# Patient Record
Sex: Male | Born: 1980 | Race: Black or African American | Hispanic: No | Marital: Single | Smoking: Current every day smoker
Health system: Southern US, Community
[De-identification: ages and names within clinical notes are randomized; demographics above are authoritative.]

## PROBLEM LIST (undated history)

## (undated) DIAGNOSIS — K219 Gastro-esophageal reflux disease without esophagitis: Secondary | ICD-10-CM

## (undated) DIAGNOSIS — F419 Anxiety disorder, unspecified: Secondary | ICD-10-CM

## (undated) DIAGNOSIS — J189 Pneumonia, unspecified organism: Secondary | ICD-10-CM

## (undated) DIAGNOSIS — F8081 Childhood onset fluency disorder: Secondary | ICD-10-CM

## (undated) DIAGNOSIS — I499 Cardiac arrhythmia, unspecified: Secondary | ICD-10-CM

## (undated) HISTORY — PX: KNEE SURGERY: SHX244

## (undated) HISTORY — PX: KNEE ARTHROSCOPY W/ ACL RECONSTRUCTION: SHX1858

---
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
## 2001-07-13 ENCOUNTER — Emergency Department (HOSPITAL_COMMUNITY): Admission: EM | Admit: 2001-07-13 | Discharge: 2001-07-13 | Payer: Self-pay | Admitting: Emergency Medicine

## 2001-08-06 ENCOUNTER — Emergency Department (HOSPITAL_COMMUNITY): Admission: EM | Admit: 2001-08-06 | Discharge: 2001-08-06 | Payer: Self-pay

## 2001-08-06 ENCOUNTER — Encounter: Payer: Self-pay | Admitting: Emergency Medicine

## 2002-06-17 ENCOUNTER — Emergency Department (HOSPITAL_COMMUNITY): Admission: EM | Admit: 2002-06-17 | Discharge: 2002-06-18 | Payer: Self-pay | Admitting: Emergency Medicine

## 2002-06-17 ENCOUNTER — Encounter: Payer: Self-pay | Admitting: Emergency Medicine

## 2003-02-14 ENCOUNTER — Encounter: Payer: Self-pay | Admitting: Emergency Medicine

## 2003-02-14 ENCOUNTER — Emergency Department (HOSPITAL_COMMUNITY): Admission: EM | Admit: 2003-02-14 | Discharge: 2003-02-14 | Payer: Self-pay

## 2003-06-27 ENCOUNTER — Encounter: Payer: Self-pay | Admitting: Emergency Medicine

## 2003-06-27 ENCOUNTER — Emergency Department (HOSPITAL_COMMUNITY): Admission: EM | Admit: 2003-06-27 | Discharge: 2003-06-27 | Payer: Self-pay | Admitting: Emergency Medicine

## 2003-09-28 ENCOUNTER — Other Ambulatory Visit: Payer: Self-pay

## 2005-10-29 ENCOUNTER — Emergency Department: Payer: Self-pay | Admitting: Emergency Medicine

## 2006-07-25 ENCOUNTER — Emergency Department (HOSPITAL_COMMUNITY): Admission: EM | Admit: 2006-07-25 | Discharge: 2006-07-25 | Payer: Self-pay | Admitting: Emergency Medicine

## 2006-11-30 ENCOUNTER — Emergency Department (HOSPITAL_COMMUNITY): Admission: EM | Admit: 2006-11-30 | Discharge: 2006-11-30 | Payer: Self-pay | Admitting: Family Medicine

## 2006-12-11 ENCOUNTER — Emergency Department (HOSPITAL_COMMUNITY): Admission: EM | Admit: 2006-12-11 | Discharge: 2006-12-11 | Payer: Self-pay | Admitting: Family Medicine

## 2007-04-12 ENCOUNTER — Other Ambulatory Visit: Payer: Self-pay

## 2007-04-12 ENCOUNTER — Emergency Department: Payer: Self-pay | Admitting: Emergency Medicine

## 2007-04-19 ENCOUNTER — Emergency Department: Payer: Self-pay | Admitting: Emergency Medicine

## 2007-09-23 ENCOUNTER — Emergency Department: Payer: Self-pay | Admitting: Emergency Medicine

## 2007-09-23 ENCOUNTER — Other Ambulatory Visit: Payer: Self-pay

## 2007-10-09 ENCOUNTER — Emergency Department: Payer: Self-pay | Admitting: Emergency Medicine

## 2007-10-20 ENCOUNTER — Emergency Department (HOSPITAL_COMMUNITY): Admission: EM | Admit: 2007-10-20 | Discharge: 2007-10-20 | Payer: Self-pay | Admitting: Emergency Medicine

## 2007-10-30 ENCOUNTER — Emergency Department: Payer: Self-pay | Admitting: Emergency Medicine

## 2007-10-30 ENCOUNTER — Other Ambulatory Visit: Payer: Self-pay

## 2011-12-18 ENCOUNTER — Encounter (HOSPITAL_COMMUNITY): Payer: Self-pay | Admitting: Emergency Medicine

## 2011-12-18 ENCOUNTER — Emergency Department (INDEPENDENT_AMBULATORY_CARE_PROVIDER_SITE_OTHER)
Admission: EM | Admit: 2011-12-18 | Discharge: 2011-12-18 | Disposition: A | Discharge status: Home / Self Care | Payer: Self-pay | Source: Home / Self Care | Attending: Emergency Medicine | Admitting: Emergency Medicine

## 2011-12-18 DIAGNOSIS — K047 Periapical abscess without sinus: Secondary | ICD-10-CM

## 2011-12-18 MED ORDER — HYDROCODONE-ACETAMINOPHEN 5-500 MG PO TABS: 1.0000 | ORAL_TABLET | Freq: Four times a day (QID) | ORAL | Status: AC | PRN

## 2011-12-18 MED ORDER — PENICILLIN V POTASSIUM 500 MG PO TABS: 500.0000 mg | ORAL_TABLET | Freq: Four times a day (QID) | ORAL | Status: AC

## 2011-12-18 NOTE — ED Notes (Signed)
Tooth pain for a "long time" that has gotten worse over last several days. Pt states today he noted some swelling and even further increased pain. Pt has difficulty chewing. OTC meds do not seem to help. Pain is "20/10"

## 2011-12-18 NOTE — ED Provider Notes (Signed)
History     CSN: [id number]  Arrival date & time 12/18/11  1618   First MD Initiated Contact with Patient 12/18/11 1627      Chief Complaint  Patient presents with  . Dental Pain    (Consider location/radiation/quality/duration/timing/severity/associated sxs/prior treatment) HPI Comments: Patient presents urgent care complaining of left lower dental pain has been going on for months. Within the last 3 days the last left lower molar has been throbbing and hurting woke up this morning with swelling of his left lower jaw line. No fevers no further symptoms.  Patient is a 31 y.o. male presenting with tooth pain. The history is provided by the patient.  Dental PainThe primary symptoms include mouth pain and shortness of breath. Primary symptoms do not include dental injury, oral bleeding, fever, sore throat, angioedema or cough. The symptoms began more than 1 month ago. The symptoms are worsening. The symptoms occur constantly.  Additional symptoms include: dental sensitivity to temperature, gum swelling and gum tenderness. Additional symptoms do not include: trouble swallowing, pain with swallowing, excessive salivation, dry mouth, swollen glands and fatigue. Medical issues do not include: alcohol problem.    History reviewed. No pertinent past medical history.  History reviewed. No pertinent past surgical history.  History reviewed. No pertinent family history.  History  Substance Use Topics  . Smoking status: Not on file  . Smokeless tobacco: Not on file  . Alcohol Use: Not on file      Review of Systems  Constitutional: Negative for fever, chills and fatigue.  HENT: Negative for sore throat and trouble swallowing.   Eyes: Negative for pain.  Respiratory: Positive for shortness of breath. Negative for cough.     Allergies  Review of patient's allergies indicates no known allergies.  Home Medications   Current Outpatient Rx  Name Route Sig Dispense Refill  .  HYDROCODONE-ACETAMINOPHEN 5-500 MG PO TABS Oral Take 1 tablet by mouth every 6 (six) hours as needed for pain. 15 tablet 0  . PENICILLIN V POTASSIUM 500 MG PO TABS Oral Take 1 tablet (500 mg total) by mouth 4 (four) times daily. 20 tablet 0    BP 125/69  Pulse 68  Temp(Src) 98.8 F (37.1 C) (Oral)  Resp 16  SpO2 100%  Physical Exam  Constitutional: He appears well-developed and well-nourished. No distress.  HENT:  Head: Normocephalic. No trismus in the jaw.  Mouth/Throat: Oropharynx is clear and moist and mucous membranes are normal. No oral lesions. Dental abscesses and dental caries present. No uvula swelling.    Neck: Neck supple. No JVD present.  Lymphadenopathy:    He has no cervical adenopathy.  Neurological: He is alert.  Skin: Skin is warm. No erythema.    ED Course  Procedures (including critical care time)  Labs Reviewed - No data to display No results found.   1. Abscess, dental       MDM  Early abscess formation L lower molar        Jimmie Molly, MD 12/18/11 1657

## 2011-12-18 NOTE — Discharge Instructions (Signed)
Please call is please call the dentist as discussed as stated in the followup section of your discharge instructions to frequent mouthwash especially after feedings. Start with antibiotics as prescribed and he is pain medicine with precautions as you can become dizzy or drowsy.    Dental Abscess A dental abscess usually starts from an infected tooth. Antibiotic medicine and pain pills can be helpful, but dental infections require the attention of a dentist. Rinse around the infected area often with salt water (a pinch of salt in 8 oz of warm water). Do not apply heat to the outside of your face. See your dentist or oral surgeon as soon as possible.  SEEK IMMEDIATE MEDICAL CARE IF:  You have increasing, severe pain that is not relieved by medicine.   You or your child has an oral temperature above 102 F (38.9 C), not controlled by medicine.   Your baby is older than 3 months with a rectal temperature of 102 F (38.9 C) or higher.   Your baby is 68 months old or younger with a rectal temperature of 100.4 F (38 C) or higher.   You develop chills, severe headache, difficulty breathing, or trouble swallowing.   You have swelling in the neck or around the eye.  Document Released: 10/20/2005 Document Revised: 07/02/2011 Document Reviewed: 03/31/2007 [facility] Patient Information 2012 [place], [place].

## 2013-06-22 ENCOUNTER — Encounter (HOSPITAL_COMMUNITY): Payer: Self-pay | Admitting: *Deleted

## 2013-06-22 ENCOUNTER — Emergency Department (HOSPITAL_COMMUNITY)
Admission: EM | Admit: 2013-06-22 | Discharge: 2013-06-23 | Disposition: A | Discharge status: Home / Self Care | Payer: Self-pay | Attending: Emergency Medicine | Admitting: Emergency Medicine

## 2013-06-22 DIAGNOSIS — Z23 Encounter for immunization: Secondary | ICD-10-CM | POA: Insufficient documentation

## 2013-06-22 DIAGNOSIS — T23219A Burn of second degree of unspecified thumb (nail), initial encounter: Secondary | ICD-10-CM | POA: Insufficient documentation

## 2013-06-22 DIAGNOSIS — T23159A Burn of first degree of unspecified palm, initial encounter: Secondary | ICD-10-CM | POA: Insufficient documentation

## 2013-06-22 DIAGNOSIS — X19XXXA Contact with other heat and hot substances, initial encounter: Secondary | ICD-10-CM | POA: Insufficient documentation

## 2013-06-22 DIAGNOSIS — Y93G3 Activity, cooking and baking: Secondary | ICD-10-CM | POA: Insufficient documentation

## 2013-06-22 DIAGNOSIS — T23102A Burn of first degree of left hand, unspecified site, initial encounter: Secondary | ICD-10-CM

## 2013-06-22 DIAGNOSIS — Y9289 Other specified places as the place of occurrence of the external cause: Secondary | ICD-10-CM | POA: Insufficient documentation

## 2013-06-22 NOTE — ED Notes (Signed)
Pt states he was cooking this pm and grabbed a hot pot; pt with blistering and blanching to left thumb, palm and pointer finger.

## 2013-06-23 MED ORDER — OXYCODONE-ACETAMINOPHEN 5-325 MG PO TABS: 1.0000 | ORAL_TABLET | ORAL | Status: DC | PRN

## 2013-06-23 MED ORDER — TETANUS-DIPHTH-ACELL PERTUSSIS 5-2.5-18.5 LF-MCG/0.5 IM SUSP
0.5000 mL | Freq: Once | INTRAMUSCULAR | Status: AC
  Administered 2013-06-23: 0.5 mL via INTRAMUSCULAR
  Filled 2013-06-23: qty 0.5

## 2013-06-23 MED ORDER — OXYCODONE-ACETAMINOPHEN 5-325 MG PO TABS
1.0000 | ORAL_TABLET | Freq: Once | ORAL | Status: AC
  Administered 2013-06-23: 1 via ORAL
  Filled 2013-06-23: qty 1

## 2013-06-23 MED ORDER — SILVER SULFADIAZINE 1 % EX CREA
TOPICAL_CREAM | Freq: Once | CUTANEOUS | Status: AC
  Administered 2013-06-23: 01:00:00 via TOPICAL
  Filled 2013-06-23: qty 50
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
## 2013-06-23 NOTE — ED Provider Notes (Signed)
Medical screening examination/treatment/procedure(s) were performed by non-physician practitioner and as supervising physician I was immediately available for consultation/collaboration.  Sunnie Nielsen, MD 06/23/13 [phone]

## 2013-06-23 NOTE — ED Notes (Signed)
Pt's L hand dressed with sulfadine cream, Telfa gauze and bulky gauze dressing. Pt instructed on further wound care.

## 2013-11-06 ENCOUNTER — Emergency Department (HOSPITAL_COMMUNITY)
Admission: EM | Admit: 2013-11-06 | Discharge: 2013-11-06 | Disposition: A | Discharge status: Home / Self Care | Payer: Self-pay | Attending: Emergency Medicine | Admitting: Emergency Medicine

## 2013-11-06 ENCOUNTER — Encounter (HOSPITAL_COMMUNITY): Payer: Self-pay | Admitting: Emergency Medicine

## 2013-11-06 ENCOUNTER — Emergency Department (HOSPITAL_COMMUNITY): Payer: Self-pay

## 2013-11-06 DIAGNOSIS — R143 Flatulence: Secondary | ICD-10-CM

## 2013-11-06 DIAGNOSIS — R110 Nausea: Secondary | ICD-10-CM | POA: Insufficient documentation

## 2013-11-06 DIAGNOSIS — F17200 Nicotine dependence, unspecified, uncomplicated: Secondary | ICD-10-CM | POA: Insufficient documentation

## 2013-11-06 DIAGNOSIS — R079 Chest pain, unspecified: Secondary | ICD-10-CM

## 2013-11-06 DIAGNOSIS — R142 Eructation: Secondary | ICD-10-CM | POA: Insufficient documentation

## 2013-11-06 DIAGNOSIS — R002 Palpitations: Secondary | ICD-10-CM

## 2013-11-06 DIAGNOSIS — R141 Gas pain: Secondary | ICD-10-CM | POA: Insufficient documentation

## 2013-11-06 DIAGNOSIS — R0602 Shortness of breath: Secondary | ICD-10-CM | POA: Insufficient documentation

## 2013-11-06 DIAGNOSIS — R209 Unspecified disturbances of skin sensation: Secondary | ICD-10-CM | POA: Insufficient documentation

## 2013-11-06 DIAGNOSIS — R42 Dizziness and giddiness: Secondary | ICD-10-CM | POA: Insufficient documentation

## 2013-11-06 DIAGNOSIS — R0789 Other chest pain: Secondary | ICD-10-CM | POA: Insufficient documentation

## 2013-11-06 LAB — CBC WITH DIFFERENTIAL/PLATELET
Basophils Absolute: 0 10*3/uL (ref 0.0–0.1)
Basophils Relative: 0 % (ref 0–1)
Eosinophils Absolute: 0.1 10*3/uL (ref 0.0–0.7)
Eosinophils Relative: 2 % (ref 0–5)
HCT: 39.4 % (ref 39.0–52.0)
Hemoglobin: 13.4 g/dL (ref 13.0–17.0)
Lymphocytes Relative: 43 % (ref 12–46)
Lymphs Abs: 2.6 10*3/uL (ref 0.7–4.0)
MCH: 26.6 pg (ref 26.0–34.0)
MCHC: 34 g/dL (ref 30.0–36.0)
MCV: 78.2 fL (ref 78.0–100.0)
Monocytes Absolute: 1 10*3/uL (ref 0.1–1.0)
Monocytes Relative: 16 % — ABNORMAL HIGH (ref 3–12)
Neutro Abs: 2.4 10*3/uL (ref 1.7–7.7)
Neutrophils Relative %: 39 % — ABNORMAL LOW (ref 43–77)
Platelets: 227 10*3/uL (ref 150–400)
RBC: 5.04 MIL/uL (ref 4.22–5.81)
RDW: 14.8 % (ref 11.5–15.5)
WBC: 6.1 10*3/uL (ref 4.0–10.5)

## 2013-11-06 LAB — COMPREHENSIVE METABOLIC PANEL
ALT: 26 U/L (ref 0–53)
AST: 20 U/L (ref 0–37)
Albumin: 3.7 g/dL (ref 3.5–5.2)
Alkaline Phosphatase: 130 U/L — ABNORMAL HIGH (ref 39–117)
BUN: 6 mg/dL (ref 6–23)
CO2: 26 mEq/L (ref 19–32)
Calcium: 9 mg/dL (ref 8.4–10.5)
Chloride: 101 mEq/L (ref 96–112)
Creatinine, Ser: 0.73 mg/dL (ref 0.50–1.35)
GFR calc Af Amer: >90 mL/min (ref >90)
GFR calc non Af Amer: >90 mL/min (ref >90)
Glucose, Bld: 102 mg/dL — ABNORMAL HIGH (ref 70–99)
Potassium: 3.4 mEq/L — ABNORMAL LOW (ref 3.7–5.3)
Sodium: 139 mEq/L (ref 137–147)
Total Bilirubin: 0.4 mg/dL (ref 0.3–1.2)
Total Protein: 7.6 g/dL (ref 6.0–8.3)

## 2013-11-06 LAB — TROPONIN I: Troponin I: <0.3 ng/mL (ref <0.30)

## 2013-11-06 NOTE — Discharge Instructions (Signed)
First, avoid caffenated drinks such as sodas as it can cause your heart to race.  Follow up with cardiologist for further evaluation of your symptoms.  Return to ER if worsen.    Cardiac Arrhythmia Your heart is a muscle that works to pump blood through your body by regular contractions. The beating of your heart is controlled by a system of special pacemaker cells. These cells control the electrical activity of the heart. When the system controlling this regular beating is disturbed, a heart rhythm abnormality (arrhythmia) results. WHEN YOUR HEART SKIPS A BEAT One of the most common and least serious heart arrhythmias is called an ectopic or premature atrial heartbeat (PAC). This may be noticed as a small change in your regular pulse. A PAC originates from the top part (atrium) of the heart. Within the right atrium, the SA node is the area that normally controls the regularity of the heart. PACs occur in heart tissue outside of the SA node region. You may feel this as a skipped beat or heart flutter, especially if several occur in succession or occur frequently.  Another arrhythmia is ventricular premature complex (VCP or PVC). These extra beats start out in the bottom, more muscular chambers of the heart. In most cases a PVC is harmless. If there are underlying causes that are making the heart irritable such as an overactive thyroid or a prior heart attack PVCs may be of more concern. In a few cases, medications to control the heart rhythm may be prescribed. Things to try at home:  Cut down or avoid alcohol, tobacco and caffeine.  Get enough sleep.  Reduce stress.  Exercise more. WHEN THE HEART BEATS TOO FAST Atrial tachycardia is a fast heart rate, which starts out in the atrium. It may last from minutes to much longer. Your heart may beat 140 to 240 times per minute instead of the normal 60 to 100.  Symptoms include a worried feeling (anxiety) and a sense that your heart is beating fast and  hard.  You may be able to stop the fast rate by holding your breath or bearing down as if you were going to have a bowel movement.  This type of fast rate is usually not dangerous. Atrial fibrillation and atrial flutter are other fast rhythms that start in the atria. Both conditions keep the atria from filling with enough blood so the heart does not work well.  Symptoms include feeling light-headed or faint.  These fast rates may be the result of heart damage or disease. Too much thyroid hormone may play a role.  There may be no clear cause or it may be from heart disease or damage.  Medication or a special electrical treatment (cardioversion) may be needed to get the heart beating normally. Ventricular tachycardia is a fast heart rate that starts in the lower muscular chambers (ventricles) This is a serious disorder that requires treatment as soon as possible. You need someone else to get and use a small defibrillator.  Symptoms include collapse, chest pain, or being short of breath.  Treatment may include medication, procedures to improve blood flow to the heart, or an implantable cardiac defibrillator (ICD). DIAGNOSIS   A cardiogram (EKG or ECG) will be done to see the arrhythmia, as well as lab tests to check the underlying cause.  If the extra beats or fast rate come and go, you may wear a Holter monitor that records your heart rate for a longer period of time. SEEK MEDICAL CARE IF:  You have irregular or fast heartbeats (palpitations).  You experience skipped beats.  You develop lightheadedness.  You have chest discomfort.  You have shortness of breath.  You have more frequent episodes, if you are already being treated. SEEK IMMEDIATE MEDICAL CARE IF:   You have severe chest pain, especially if the pain is crushing or pressure-like and spreads to the arms, back, neck, or jaw, or if you have sweating, feeling sick to your stomach (nausea), or shortness of breath. THIS IS  AN EMERGENCY. Do not wait to see if the pain will go away. Get medical help at once. Call 911 or 0 (operator). DO NOT drive yourself to the hospital.  You feel dizzy or faint.  You have episodes of previously documented atrial tachycardia that do not resolve with the techniques your caregiver has taught you.  Irregular or rapid heartbeats begin to occur more often than in the past, especially if they are associated with more pronounced symptoms or of longer duration. Document Released: 10/20/2005 Document Revised: 01/12/2012 Document Reviewed: 06/07/2008 [facility] Patient Information 2014 [place], [place].   Emergency Department Resource Guide 1) Find a Doctor and Pay Out of Pocket Although you won't have to find out who is covered by your insurance plan, it is a good idea to ask around and get recommendations. You will then need to call the office and see if the doctor you have chosen will accept you as a new patient and what types of options they offer for patients who are self-pay. Some doctors offer discounts or will set up payment plans for their patients who do not have insurance, but you will need to ask so you aren't surprised when you get to your appointment.  2) Contact Your Local Health Department Not all health departments have doctors that can see patients for sick visits, but many do, so it is worth a call to see if yours does. If you don't know where your local health department is, you can check in your phone book. The CDC also has a tool to help you locate your state's health department, and many state websites also have listings of all of their local health departments.  3) Find a Walk-in Clinic If your illness is not likely to be very severe or complicated, you may want to try a walk in clinic. These are popping up all over the country in pharmacies, drugstores, and shopping centers. They're usually staffed by nurse practitioners or physician assistants that have been trained to  treat common illnesses and complaints. They're usually fairly quick and inexpensive. However, if you have serious medical issues or chronic medical problems, these are probably not your best option.  No Primary Care Doctor: - Call Health Connect at  [phone] - they can help you locate a primary care doctor that  accepts your insurance, provides certain services, etc. - Physician Referral Service- [phone]  Chronic Pain Problems: Organization         Address  Phone   Notes  [facility]  [phone] Patients need to be referred by their primary care doctor.   Medication Assistance: Organization         Address  Phone   Notes  [facility] Medication [facility] [address] [place] [place], [place] [address] [phone] --Must be a resident of [facility] -- Must have NO insurance coverage whatsoever (no Medicaid/ Medicare, etc.) -- The pt. MUST have a primary care doctor that directs their care regularly  and follows them in the community   [facility]  [phone]   [employer]  [phone]    Agencies that provide inexpensive medical care: Organization         Address  Phone   Notes  [facility]  [phone]   [facility]    [phone]   [facility] [address] [place], [place] [address] [phone]   [facility] [address] [place] [place] [phone]   Planned Parenthood    [phone]   [facility]    [phone]   [facility]  [address], [place] Phone:  [phone], Fax:  [phone] Hours of Operation:  9 am - 6 pm, M-F.  Also accepts Medicaid/Medicare and self-pay.  [facility]  [address], [place] Phone: [phone], Fax: [phone]. Hours of Operation:  8:30 am - 5:30 pm, M-F.  Also accepts Medicaid and self-pay.  [facility]  [place] [address], [place] Phone: [phone]   [facility] [address] Natasha Bence [place], [place] [phone], Ext. [phone] Mondays & Thursdays: 7-9 AM.  First 15 patients are seen on a first come, first serve basis.    Medicaid-accepting [facility] Providers:  Organization         Address  Phone   Notes  [facility] [address], [place] [phone] Also accepts self-pay patients.  [facility] [address] Laurell Josephs [place], [place]  [phone]   [facility] [address], [place] [phone]   [facility] [address], [place] [phone]   Renaye Rakers [address], [place]   [phone] Only accepts [place] patients after they have their name applied to their card.   Self-Pay (no insurance) in [facility]:  Organization         Address  Phone   Notes  Sickle Cell Patients, [facility] [address] [place], [place] [phone]   [facility] [address] [place], [place] [phone]   [facility] [place]  [address],  [phone]   [facility]/Dr. Osei-Bonsu  [address] [place] or [address], [place] [phone] Phone number for both [place] and [place] locations is the same.  [facility] [address] [place] [phone]   [facility] [address] [place] or [address] [phone]   [facility] [address] [place] [phone], phone; [phone], fax Sees patients 1st and 3rd Saturday of every month.  Must not qualify for public or private insurance (i.e. Medicaid, Medicare, [place] Health Choice, Veterans' Benefits)  Household income should be no more than 200%  of the poverty level The clinic cannot treat you if you are pregnant or think you are pregnant  Sexually transmitted diseases are not treated at the clinic.    Dental Care: Organization         Address  Phone  Notes  [facility] of [facility] [address] [place], [place] [phone] Accepts children up to age 2 who  are enrolled in Medicaid or [place] Health Choice; pregnant women with a Medicaid card; and children who have applied for Medicaid or [place] Health Choice, but were declined, whose parents can pay a reduced fee at time of service.  [facility] Department of [facility]  [address], [place] [phone] Accepts children up to age 56 who are enrolled in [place] or [place] Health Choice; pregnant women with a Medicaid card; and children who have applied for Medicaid or [place] Health Choice, but were declined, whose parents can pay a reduced fee at time of service.  [facility]  [address] [place], [place] [phone] Patients are seen by appointment only. Walk-ins are not accepted. [facility] will see patients 52 years of age and older. Monday - Tuesday (8am-5pm) Most Wednesdays (8:30-5pm) $30 per visit, cash only  [facility] Adult Dental Access PROGRAM  [address], [facility] [phone] Patients are seen by appointment only. Walk-ins are not accepted. [facility] will see patients 56 years of age and older. One Wednesday Evening (Monthly: Volunteer Based).  $30 per visit, cash only  [employer]  [phone] for adults; Children under age 74, call Graduate Pediatric Dentistry at [phone]. Children aged 72-14, please call [phone] to request a pediatric application.  Dental services are provided in all areas of dental care including fillings, crowns and bridges, complete and partial dentures, implants, gum treatment, root canals, and  extractions. Preventive care is also provided. Treatment is provided to both adults and children. Patients are selected via a lottery and there is often a waiting list.   [facility] [address] [place]  [phone] [email or web address]   [facility] [address] [place], [place] [phone], Ext. [phone] Second and Fourth Thursday of each month, opens at 6:30 AM; Clinic ends at 9 AM.  Patients are seen on a first-come first-served basis, and a limited number are seen during each clinic.   [facility]  [address] Ether Griffins [place], [place] [phone]   Eligibility Requirements You must have lived in [place], [place], or [place] counties for at least the last three months.   You cannot be eligible for state or federal sponsored [place], including [employer], [place], or [employer].   You generally cannot be eligible for healthcare insurance through your employer.    How to apply: Eligibility screenings are held every Tuesday and Wednesday afternoon from 1:00 pm until 4:00 pm. You do not need an appointment for the interview!  [facility] [address] [place], [place] [phone]   [facility] Health Department  [phone]   [facility] Health Department  [phone]   [facility]  [phone]    Behavioral Health Resources in the Community: Intensive Outpatient Programs Organization         Address  Phone  Notes  [facility] [address] [place], [place] [phone]   [facility] Outpatient [address] [place], [place] [phone]   [facility]: [facility] [address] [place], [place]  [phone]   [facility] Mental Health [address]  [place], [place] [phone] or [phone]   Substance Abuse Resources Organization         Address  Phone  Notes  [facility]  [phone]     [facility]  [phone]   [facility]  [phone]   [facility]  [phone]   Residential & Outpatient Substance Abuse Program  [phone]   Psychological Services Organization         Address  Phone  Notes  [facility]  [phone]- [phone]   [facility]  [phone]- [phone]   [facility] [address], [place] [phone] or [phone]    Mobile Crisis Teams Organization         Address  Phone  Notes  [facility], Mobile Crisis Care Unit  [phone]   [facility]  [address] [place], [place] [phone]   Doristine LocksSharon DeEsch [address] [place] [phone]    Self-Help/Support Groups Organization         Address  Phone             Notes  [facility] [place] - variety of support groups  336- [phone] Call for more information  Narcotics Anonymous (NA), [facility] [address], [place]  2 meetings at this location   Statisticianesidential Treatment Programs Organization         Address  Phone  Notes  [facility] [address],    [place]  [phone]   [facility]  [address], [place] [phone], [place], [place] [phone]   [facility] [address], [place] [phone] Admissions: 8am-3pm M-F  [facility] [address]    [place], [place] [phone]   [facility] [address], [place], [place] [phone]   [facility] [address]  [place], [place] [phone]   [facility] - Intensive Outpatient [address], [place], [place] [phone]   [facility] ([facility] [address]  [place], [place] [phone] or [phone]   [facility] ([facility]) [address] [place], [place] [phone] Accepts Medicaid  [facility] [address]   [place] [phone] Substance Abuse/Addiction Treatment   [facility] Resources Organization         Address  Phone  Notes  [facility]  [phone]) [phone]   Angie FavaJulie Brannon, PhD [address] [place], [place]   [phone](336) [phone] or [phone]   [facility]   [address] [place], [place] [phone]) [phone]   [facility] [address], [place], [place] [phone](336) [phone] Insurance/Medicaid/sponsorship through [facility]  [facility] Families [address]                                    [place], [place] [phone]) [phone] Therapy/tele-psych/case  [facility] [address]   [place], [place] [phone](336) [phone]    Dr. Lolly MustacheArfeen  [phone]) [phone]   [facility]  [employer] [facility] 1) [address], [place] 2) [address], [place] 3)  [address], [place] [phone](336) [phone]) [phone]  [phone](336) [phone]   [facility] Child Abuse Hotline [phone](336) 7011744002 or [phone](336) [phone] (After Hours)

## 2013-11-06 NOTE — ED Provider Notes (Signed)
Medical screening examination/treatment/procedure(s) were performed by non-physician practitioner and as supervising physician I was immediately available for consultation/collaboration.    Nelia Shiobert L Eames Dibiasio, MD 11/06/13 1025

## 2013-11-06 NOTE — ED Provider Notes (Signed)
CSN: [id number]     Arrival date & time 11/06/13  0559 History   First MD Initiated Contact with Patient 11/06/13 [phone]     Chief Complaint  Patient presents with  . Chest Pain   (Consider location/radiation/quality/duration/timing/severity/associated sxs/prior Treatment) HPI  33 year old male with no significant past medical history presents complaining of chest pain. Patient reports intermittent left chest discomfort ongoing for the past 2 weeks. He describes sensation as a  pressure sensation to left upper chest with tingling sensation radiates to his left arm usually lasting for minutes but last night it lasted for about 2 hours. Symptoms usually happens at nighttime when he is resting. He reports having this indigestion sensation with burping follows by heart palpitation. He reports intermittent shortness of breath, lightheadedness and feeling nauseous. The symptoms cause him to feel panicky.  The last episode was at 7 PM last night lasted for 2 hours. Patient thought he has GERD and took some antacid but it did not help. Patient reports he smokes and drink a lot of soda and unsure if this is related to it. Denies any family history of premature cardiac death. Denies fever, chills, productive cough, hemoptysis, abdominal pain, or rash. No prior history of PE or DVT, no recent surgery, prolonged bed rest, history of cancer, calf pain or leg swelling. States he has had symptoms like this in the past and was seen by a doctor and was told that he has irregular heart rhythm. Denies any prior history of exertional syncope.  History reviewed. No pertinent past medical history. Past Surgical History  Procedure Laterality Date  . Knee arthroscopy w/ acl reconstruction     No family history on file. History  Substance Use Topics  . Smoking status: Never Smoker   . Smokeless tobacco: Not on file  . Alcohol Use: Yes     Comment: socially    Review of Systems  All other systems reviewed and are  negative.    Allergies  Review of patient's allergies indicates no known allergies.  Home Medications   Current Outpatient Rx  Name  Route  Sig  Dispense  Refill  . HYDROcodone-acetaminophen (NORCO/VICODIN) 5-325 MG per tablet   Oral   Take 1 tablet by mouth once.         Marland Kitchen oxyCODONE-acetaminophen (PERCOCET/ROXICET) 5-325 MG per tablet   Oral   Take 1 tablet by mouth every 4 (four) hours as needed for pain.   15 tablet   0    BP 127/82  Pulse 65  Temp(Src) 98.4 F (36.9 C) (Oral)  Resp 12  SpO2 100% Physical Exam  Constitutional: He appears well-developed and well-nourished. No distress.  HENT:  Head: Atraumatic.  Eyes: Conjunctivae are normal.  Neck: Normal range of motion. Neck supple.  Cardiovascular: Normal rate and regular rhythm.  Exam reveals no gallop and no friction rub.   No murmur heard. Pulmonary/Chest: Effort normal and breath sounds normal. No respiratory distress. He exhibits no tenderness.  Abdominal: Soft. There is no tenderness.  Musculoskeletal: He exhibits no edema.  Bilateral lower extremities without palpable cords, erythema, edema, negative Homans sign.  Neurological: He is alert.  Skin: No rash noted.  Psychiatric: He has a normal mood and affect.    ED Course  Procedures (including critical care time)   Date: 11/06/2013  Rate: 71   Rhythm: normal sinus rhythm with occasional PVC  QRS Axis: normal  Intervals: PR prolonged  ST/T Wave abnormalities: normal  Conduction Disutrbances:incomplete RBBB  Narrative  Interpretation:   Old EKG Reviewed: none available  8:15 AM Patient here with chest pressure sensation and heart palpitation. The last chest discomfort was at 7 PM last night. EKG without evidence of ischemic changes, normal troponin, chest x-ray without acute finding. EKG did show occasional PVC and incomplete right bundle branch block.  10:19 AM Pt did report drinking a lot of sodas, which i encourage to avoid caffeinated  drinks.  Otherwise, do not think this is ACS as pt has no ischemic changes on ECG and normal troponin after 10 hrs since last cp.  Not having active cp. May benefit from holter monitoring, referral given.  Care discussed with Dr. Radford PaxBeaton.     Labs Review Labs Reviewed  CBC WITH DIFFERENTIAL - Abnormal; Notable for the following:    Neutrophils Relative % 39 (*)    Monocytes Relative 16 (*)    All other components within normal limits  COMPREHENSIVE METABOLIC PANEL - Abnormal; Notable for the following:    Potassium 3.4 (*)    Glucose, Bld 102 (*)    Alkaline Phosphatase 130 (*)    All other components within normal limits  TROPONIN I   Imaging Review Dg Chest 2 View  11/06/2013   CLINICAL DATA:  Cough and fever.  EXAM: CHEST  2 VIEW  COMPARISON:  10/20/2007  FINDINGS: Two views of the chest demonstrate clear lungs. There may be small calcifications in the right hilar region. Heart size is normal. Bone sclerosis or calcifications at the right lung apex. Bony thorax is intact.  IMPRESSION: No acute cardiopulmonary disease.  Suspect old granulomatous disease   Electronically Signed   By: Richarda OverlieAdam  Henn M.D.   On: 11/06/2013 07:09    EKG Interpretation    Date/Time:  Sunday November 06 2013 06:03:49 EST Ventricular Rate:  71 PR Interval:  202 QRS Duration: 116 QT Interval:  418 QTC Calculation: 454 R Axis:   -19 Text Interpretation:  Sinus rhythm with occasional Premature ventricular complexes Incomplete right bundle branch block Borderline ECG No significant change since last tracing Confirmed by BEATON  MD, ROBERT ([id number]) on 11/06/2013 8:38:02 AM            MDM   1. Chest pain   2. Heart palpitations    BP 119/70  Pulse 58  Temp(Src) 98.4 F (36.9 C) (Oral)  Resp 16  SpO2 100%  I have reviewed nursing notes and vital signs. I personally reviewed the imaging tests through PACS system  I reviewed available ER/hospitalization records thought the EMR     Fayrene HelperBowie Wonda Goodgame,  PA-C 11/06/13 1022

## 2013-11-06 NOTE — ED Notes (Signed)
The pt is c/o lt arm pain and lt upper chest pain since 2100 tonight with sob dizziness and nausea

## 2014-07-14 ENCOUNTER — Encounter (HOSPITAL_COMMUNITY): Payer: Self-pay | Admitting: Emergency Medicine

## 2014-07-14 ENCOUNTER — Emergency Department (INDEPENDENT_AMBULATORY_CARE_PROVIDER_SITE_OTHER)
Admission: EM | Admit: 2014-07-14 | Discharge: 2014-07-14 | Disposition: A | Discharge status: Home / Self Care | Payer: Self-pay | Source: Home / Self Care

## 2014-07-14 DIAGNOSIS — K044 Acute apical periodontitis of pulpal origin: Secondary | ICD-10-CM

## 2014-07-14 DIAGNOSIS — G8929 Other chronic pain: Secondary | ICD-10-CM

## 2014-07-14 DIAGNOSIS — K029 Dental caries, unspecified: Secondary | ICD-10-CM

## 2014-07-14 DIAGNOSIS — K047 Periapical abscess without sinus: Secondary | ICD-10-CM

## 2014-07-14 DIAGNOSIS — K089 Disorder of teeth and supporting structures, unspecified: Secondary | ICD-10-CM

## 2014-07-14 MED ORDER — HYDROCODONE-ACETAMINOPHEN 5-325 MG PO TABS: 1.0000 | ORAL_TABLET | ORAL | Status: DC | PRN

## 2014-07-14 MED ORDER — AMOXICILLIN 500 MG PO CAPS: 1000.0000 mg | ORAL_CAPSULE | Freq: Two times a day (BID) | ORAL | Status: DC

## 2014-07-14 NOTE — Discharge Instructions (Signed)
Dental Pain Toothache is pain in or around a tooth. It may get worse with chewing or with cold or heat.  HOME CARE  Your dentist may use a numbing medicine during treatment. If so, you may need to avoid eating until the medicine wears off. Ask your dentist about this.  Only take medicine as told by your dentist or doctor.  Avoid chewing food near the painful tooth until after all treatment is done. Ask your dentist about this. GET HELP RIGHT AWAY IF:   The problem gets worse or new problems appear.  You have a fever.  There is redness and puffiness (swelling) of the face, jaw, or neck.  You cannot open your mouth.  There is pain in the jaw.  There is very bad pain that is not helped by medicine. MAKE SURE YOU:   Understand these instructions.  Will watch your condition.  Will get help right away if you are not doing well or get worse. Document Released: 04/07/2008 Document Revised: 01/12/2012 Document Reviewed: 04/07/2008 [facility] Patient Information 2015 [place], [place]. This information is not intended to replace advice given to you by your health care provider. Make sure you discuss any questions you have with your health care provider.  Dental Caries Dental caries (also called tooth decay) is the most common oral disease. It can occur at any age but is more common in children and young adults.  HOW DENTAL CARIES DEVELOPS  The process of decay begins when bacteria and foods (particularly sugars and starches) combine in your mouth to produce plaque. Plaque is a substance that sticks to the hard, outer surface of a tooth (enamel). The bacteria in plaque produce acids that attack enamel. These acids may also attack the root surface of a tooth (cementum) if it is exposed. Repeated attacks dissolve these surfaces and create holes in the tooth (cavities). If left untreated, the acids destroy the other layers of the tooth.  RISK FACTORS  Frequent sipping of sugary beverages.    Frequent snacking on sugary and starchy foods, especially those that easily get stuck in the teeth.   Poor oral hygiene.   Dry mouth.   Substance abuse such as methamphetamine abuse.   Broken or poor-fitting dental restorations.   Eating disorders.   Gastroesophageal reflux disease (GERD).   Certain radiation treatments to the head and neck. SYMPTOMS In the early stages of dental caries, symptoms are seldom present. Sometimes white, chalky areas may be seen on the enamel or other tooth layers. In later stages, symptoms may include:  Pits and holes on the enamel.  Toothache after sweet, hot, or cold foods or drinks are consumed.  Pain around the tooth.  Swelling around the tooth. DIAGNOSIS  Most of the time, dental caries is detected during a regular dental checkup. A diagnosis is made after a thorough medical and dental history is taken and the surfaces of your teeth are checked for signs of dental caries. Sometimes special instruments, such as lasers, are used to check for dental caries. Dental X-ray exams may be taken so that areas not visible to the eye (such as between the contact areas of the teeth) can be checked for cavities.  TREATMENT  If dental caries is in its early stages, it may be reversed with a fluoride treatment or an application of a remineralizing agent at the dental office. Thorough brushing and flossing at home is needed to aid these treatments. If it is in its later stages, treatment depends on the  location and extent of tooth destruction:   If a small area of the tooth has been destroyed, the destroyed area will be removed and cavities will be filled with a material such as gold, silver amalgam, or composite resin.   If a large area of the tooth has been destroyed, the destroyed area will be removed and a cap (crown) will be fitted over the remaining tooth structure.   If the center part of the tooth (pulp) is affected, a procedure called a root  canal will be needed before a filling or crown can be placed.   If most of the tooth has been destroyed, the tooth may need to be pulled (extracted). HOME CARE INSTRUCTIONS You can prevent, stop, or reverse dental caries at home by practicing good oral hygiene. Good oral hygiene includes:  Thoroughly cleaning your teeth at least twice a day with a toothbrush and dental floss.   Using a fluoride toothpaste. A fluoride mouth rinse may also be used if recommended by your dentist or health care provider.   Restricting the amount of sugary and starchy foods and sugary liquids you consume.   Avoiding frequent snacking on these foods and sipping of these liquids.   Keeping regular visits with a dentist for checkups and cleanings. PREVENTION   Practice good oral hygiene.  Consider a dental sealant. A dental sealant is a coating material that is applied by your dentist to the pits and grooves of teeth. The sealant prevents food from being trapped in them. It may protect the teeth for several years.  Ask about fluoride supplements if you live in a community without fluorinated water or with water that has a low fluoride content. Use fluoride supplements as directed by your dentist or health care provider.  Allow fluoride varnish applications to teeth if directed by your dentist or health care provider. Document Released: 07/12/2002 Document Revised: 03/06/2014 Document Reviewed: 10/22/2012 [facility] Patient Information 2015 [place], [place]. This information is not intended to replace advice given to you by your health care provider. Make sure you discuss any questions you have with your health care provider. Abscessed Tooth An abscessed tooth is an infection around your tooth. It may be caused by holes or damage to the tooth (cavity) or a dental disease. An abscessed tooth causes mild to very bad pain in and around the tooth. See your dentist right away if you have tooth or gum pain. HOME  CARE  Take your medicine as told. Finish it even if you start to feel better.  Do not drive after taking pain medicine.  Rinse your mouth (gargle) often with salt water ( teaspoon salt in 8 ounces of warm water).  Do not apply heat to the outside of your face. GET HELP RIGHT AWAY IF:   You have a temperature by mouth above 102 F (38.9 C), not controlled by medicine.  You have chills and a very bad headache.  You have problems breathing or swallowing.  Your mouth will not open.  You develop puffiness (swelling) on the neck or around the eye.  Your pain is not helped by medicine.  Your pain is getting worse instead of better. MAKE SURE YOU:   Understand these instructions.  Will watch your condition.  Will get help right away if you are not doing well or get worse. Document Released: 04/07/2008 Document Revised: 01/12/2012 Document Reviewed: 01/28/2011 [facility] Patient Information 2015 [place], [place]. This information is not intended to replace advice given to you by your  health care provider. Make sure you discuss any questions you have with your health care provider. ° °

## 2014-07-14 NOTE — ED Provider Notes (Signed)
Medical screening examination/treatment/procedure(s) were performed by resident physician or non-physician practitioner and as supervising physician I was immediately available for consultation/collaboration.   Barkley Bruns MD.   Linna Hoff, MD 07/14/14 [phone]

## 2014-07-14 NOTE — ED Notes (Signed)
PT REPORTS  PAIN  AND  SWELLING  L  SIDE  OF  FACE     WITH   TOOTHACHE        WITH          X  1  DAYS          SITTING  UPRIGHT  ON  EXAM  TABLE       IN  NO  ACUTE     DISTRESS

## 2014-07-14 NOTE — ED Provider Notes (Signed)
CSN: [id number]     Arrival date & time 07/14/14  1452 History   First MD Initiated Contact with Patient 07/14/14 1505     Chief Complaint  Patient presents with  . Dental Pain   (Consider location/radiation/quality/duration/timing/severity/associated sxs/prior Treatment) HPI Comments: C/o dental pain for 6 weeks, worse past 2 d with swelling in L lower jaw adjacent to affected teeth. He saw a doctor in [place] about a month ago and tx with PCN which helped but now getting worse. Has apt next month when insurance kicks in.   History reviewed. No pertinent past medical history. Past Surgical History  Procedure Laterality Date  . Knee arthroscopy w/ acl reconstruction     History reviewed. No pertinent family history. History  Substance Use Topics  . Smoking status: Never Smoker   . Smokeless tobacco: Not on file  . Alcohol Use: Yes     Comment: socially    Review of Systems  HENT: Positive for dental problem.   All other systems reviewed and are negative.   Allergies  Review of patient's allergies indicates no known allergies.  Home Medications   Prior to Admission medications   Medication Sig Start Date End Date Taking? Authorizing Provider  amoxicillin (AMOXIL) 500 MG capsule Take 2 capsules (1,000 mg total) by mouth 2 (two) times daily. 07/14/14   Hayden Rasmussen, NP  famotidine (PEPCID) 20 MG tablet Take 20 mg by mouth daily as needed for heartburn or indigestion.    Historical Provider, MD  HYDROcodone-acetaminophen (NORCO/VICODIN) 5-325 MG per tablet Take 1 tablet by mouth every 4 (four) hours as needed. 07/14/14   Hayden Rasmussen, NP   BP 118/76  Pulse 86  Temp(Src) 98.7 F (37.1 C) (Oral)  Resp 14  SpO2 98% Physical Exam  Nursing note and vitals reviewed. Constitutional: He is oriented to person, place, and time. He appears well-developed and well-nourished. No distress.  HENT:  Mouth/Throat: No oropharyngeal exudate.  Poor dentition. Multiple caries. All 3 left  lower molars severely denuded, carious discolored and tender. Surrounding gingiva discolored gray with mild erythema. No gingival abcess seen. There is swelling and tenderness to the L lower face adjacent to affected teeth.  Neck: Normal range of motion. Neck supple.  Neurological: He is alert and oriented to person, place, and time.  Skin: Skin is warm and dry.    ED Course  Procedures (including critical care time) Labs Review Labs Reviewed - No data to display  Imaging Review No results found.   MDM   1. Chronic dental pain   2. Caries involving multiple surfaces of tooth   3. Tooth infection    Amoxicillin Norco 5 mg #15 Motrin prn     Hayden Rasmussen, NP 07/14/14 1539

## 2015-11-19 ENCOUNTER — Emergency Department (HOSPITAL_COMMUNITY)
Admission: EM | Admit: 2015-11-19 | Discharge: 2015-11-19 | Disposition: A | Discharge status: Home / Self Care | Payer: Self-pay | Attending: Emergency Medicine | Admitting: Emergency Medicine

## 2015-11-19 ENCOUNTER — Encounter (HOSPITAL_COMMUNITY): Payer: Self-pay | Admitting: *Deleted

## 2015-11-19 DIAGNOSIS — K0889 Other specified disorders of teeth and supporting structures: Secondary | ICD-10-CM | POA: Insufficient documentation

## 2015-11-19 DIAGNOSIS — K029 Dental caries, unspecified: Secondary | ICD-10-CM | POA: Insufficient documentation

## 2015-11-19 DIAGNOSIS — Z792 Long term (current) use of antibiotics: Secondary | ICD-10-CM | POA: Insufficient documentation

## 2015-11-19 MED ORDER — PENICILLIN V POTASSIUM 500 MG PO TABS: 500.0000 mg | ORAL_TABLET | Freq: Four times a day (QID) | ORAL | Status: DC

## 2015-11-19 MED ORDER — HYDROCODONE-ACETAMINOPHEN 5-325 MG PO TABS
2.0000 | ORAL_TABLET | Freq: Once | ORAL | Status: AC
  Administered 2015-11-19: 2 via ORAL
  Filled 2015-11-19: qty 2

## 2015-11-19 MED ORDER — HYDROCODONE-ACETAMINOPHEN 5-325 MG PO TABS: 1.0000 | ORAL_TABLET | ORAL | Status: DC | PRN

## 2015-11-19 NOTE — ED Notes (Signed)
See PA-C note and assessment  

## 2015-11-19 NOTE — ED Provider Notes (Signed)
CSN: [id number]     Arrival date & time 11/19/15  2206 History  By signing my name below, I, Elon Spanner, attest that this documentation has been prepared under the direction and in the presence of Glean Hess, [place]. Electronically Signed: Elon Spanner ED Scribe. 11/19/2015. 11:09 PM.    Chief Complaint  Patient presents with  . Dental Pain    The history is provided by the patient. No language interpreter was used.    HPI Comments: Sean Burch is a 35 y.o. male who presents to the Emergency Department complaining of right upper dental pain onset two days ago that became constant and worsened two days ago.  He also notes some mild swelling onset today.  The pain is worse with chewing or with bending down.  He has used ibuprofen without relief and salt water gargle with minimal, transient relief.  Patient is not followed by a dentist.  He denies fever, chills, difficulty swallowing, difficulty handling secretions, SOB.    History reviewed. No pertinent past medical history. Past Surgical History  Procedure Laterality Date  . Knee arthroscopy w/ acl reconstruction     History reviewed. No pertinent family history. Social History  Substance Use Topics  . Smoking status: Never Smoker   . Smokeless tobacco: None  . Alcohol Use: Yes     Comment: socially     Review of Systems  Constitutional: Negative for fever and chills.  HENT: Positive for dental problem. Negative for trouble swallowing.   Respiratory: Negative for shortness of breath.       Allergies  Review of patient's allergies indicates no known allergies.  Home Medications   Prior to Admission medications   Medication Sig Start Date End Date Taking? Authorizing Provider  amoxicillin (AMOXIL) 500 MG capsule Take 2 capsules (1,000 mg total) by mouth 2 (two) times daily. 07/14/14   Hayden Rasmussen, NP  famotidine (PEPCID) 20 MG tablet Take 20 mg by mouth daily as needed for heartburn or indigestion.    Historical  Provider, MD  HYDROcodone-acetaminophen (NORCO/VICODIN) 5-325 MG tablet Take 1 tablet by mouth every 4 (four) hours as needed. 11/19/15   Mady Gemma, PA-C  penicillin v potassium (VEETID) 500 MG tablet Take 1 tablet (500 mg total) by mouth 4 (four) times daily. 11/19/15   Dorise Hiss Derion Kreiter, PA-C    BP 138/82 mmHg  Pulse 69  Temp(Src) 98.9 F (37.2 C) (Oral)  Resp 16  Ht 6\' 5"  (1.956 m)  Wt 110.224 kg  BMI 28.81 kg/m2  SpO2 100% Physical Exam  Constitutional: He is oriented to person, place, and time. He appears well-developed and well-nourished. No distress.  HENT:  Head: Normocephalic and atraumatic.  Right Ear: External ear normal.  Left Ear: External ear normal.  Nose: Nose normal.  Mouth/Throat: Uvula is midline, oropharynx is clear and moist and mucous membranes are normal. Abnormal dentition. Dental caries present. No dental abscesses. No oropharyngeal exudate, posterior oropharyngeal edema, posterior oropharyngeal erythema or tonsillar abscesses.    Dental decay to right lower molars with associated TTP and mild erythema. No significant edema. No abscess. No swelling to floor of mouth.  Eyes: Conjunctivae and EOM are normal. Right eye exhibits no discharge. Left eye exhibits no discharge. No scleral icterus.  Neck: Normal range of motion. Neck supple.  Cardiovascular: Normal rate and regular rhythm.   Pulmonary/Chest: Effort normal and breath sounds normal. No respiratory distress.  Musculoskeletal: Normal range of motion. He exhibits no edema or tenderness.  Neurological:  He is alert and oriented to person, place, and time.  Skin: Skin is warm and dry. He is not diaphoretic.  Psychiatric: He has a normal mood and affect. His behavior is normal.  Nursing note and vitals reviewed.   ED Course  Procedures (including critical care time)  DIAGNOSTIC STUDIES: Oxygen Saturation is 100% on RA, normal by my interpretation.    COORDINATION OF CARE: 11:12 PM  Discussed plan to prescribe pain medication and antibiotic.  Will provide dental resource guide for f/u.  Return precautions advised.  Patient acknowledges and agrees with plan.    Labs Review Labs Reviewed - No data to display  Imaging Review No results found.     EKG Interpretation None      MDM   Final diagnoses:  Pain, dental    35 year old male presents with right lower dental pain x 2 days. Denies fever, chills, difficulty swallowing or handling secretions, shortness of breath. Patient is afebrile. Vital signs stable. On exam, he has dental decay to his right lower molars with associated TTP and mild erythema. No significant edema. No abscess. No swelling to floor of mouth to suggest ludwigs. Patient is non-toxic and well-appearing, feel he is stable for discharge at this time. Will treat with short course of pain medication and penicillin. Patient to follow-up with dentist for further evaluation and management, resource list given. Return precautions discussed. Patient verbalizes his understanding and is in agreement with plan.  BP 138/82 mmHg  Pulse 69  Temp(Src) 98.9 F (37.2 C) (Oral)  Resp 16  Ht 6\' 5"  (1.956 m)  Wt 110.224 kg  BMI 28.81 kg/m2  SpO2 100%  I personally performed the services described in this documentation, which was scribed in my presence. The recorded information has been reviewed and is accurate.     Mady Gemmalizabeth C Raynette Arras, PA-C 11/19/15 [id number]  Leta BaptistEmily Roe Nguyen, MD 11/28/15 2112

## 2015-11-19 NOTE — Discharge Instructions (Signed)
1. Medications: penicillin, pain medication, usual home medications 2. Treatment: rest, drink plenty of fluids 3. Follow Up: please followup with your dentist in 2-3 days for discussion of your diagnoses and further evaluation after today's visit; if you do not have a primary care doctor use the resource guide provided to find one; please return to the ER for increased pain or swelling, difficulty swallowing, new or worsening symptoms   Emergency Department Resource Guide 1) Find a Doctor and Pay Out of Pocket Although you won't have to find out who is covered by your insurance plan, it is a good idea to ask around and get recommendations. You will then need to call the office and see if the doctor you have chosen will accept you as a new patient and what types of options they offer for patients who are self-pay. Some doctors offer discounts or will set up payment plans for their patients who do not have insurance, but you will need to ask so you aren't surprised when you get to your appointment.  2) Contact Your Local Health Department Not all health departments have doctors that can see patients for sick visits, but many do, so it is worth a call to see if yours does. If you don't know where your local health department is, you can check in your phone book. The CDC also has a tool to help you locate your state's health department, and many state websites also have listings of all of their local health departments.  3) Find a Walk-in Clinic If your illness is not likely to be very severe or complicated, you may want to try a walk in clinic. These are popping up all over the country in pharmacies, drugstores, and shopping centers. They're usually staffed by nurse practitioners or physician assistants that have been trained to treat common illnesses and complaints. They're usually fairly quick and inexpensive. However, if you have serious medical issues or chronic medical problems, these are probably not  your best option.  No Primary Care Doctor: - Call Health Connect at  [phone] - they can help you locate a primary care doctor that  accepts your insurance, provides certain services, etc. - Physician Referral Service- [phone]  Chronic Pain Problems: Organization         Address  Phone   Notes  [facility]  [phone] Patients need to be referred by their primary care doctor.   Medication Assistance: Organization         Address  Phone   Notes  [facility] Medication [facility] [address] [place] [place], [place] [address] [phone] --Must be a resident of [facility] -- Must have NO insurance coverage whatsoever (no Medicaid/ Medicare, etc.) -- The pt. MUST have a primary care doctor that directs their care regularly and follows them in the community   [facility]  [phone]   [employer]  [phone]    Agencies that provide inexpensive medical care: Organization         Address  Phone   Notes  [facility]  [phone]   [facility]    [phone]   [facility] [address] [place], [place] [address] [phone]   [facility] [address] [place] [place] [phone]   Planned Parenthood    [phone]   [facility]    [phone]   [facility]  [address], [place] Phone:  [phone], Fax:  [phone](336) [phone] Hours of Operation:  9 am - 6 pm, M-F.  Also accepts Medicaid/Medicare and self-pay.  [facility]  [address], [place] Phone: [phone]) [phone], Fax: [phone]) [phone]. Hours of Operation:  8:30 am - 5:30 pm, M-F.  Also accepts Medicaid and self-pay.  [facility] [place] [address], [place] Phone: [phone]) [phone]   [facility] [address], [place], [place] [phone], Ext. [phone] Mondays & Thursdays: 7-9 AM.  First  15 patients are seen on a first come, first serve basis.    Medicaid-accepting [facility] Providers:  Organization         Address  Phone   Notes  [facility] [address], [place] [phone] Also accepts self-pay patients.  [facility] [address] [place], [place]  [phone]) [phone]   [facility] [address], [place] [phone]) [phone]   [phone] [address] [place] [address], [place] [phone](336) [phone]   Renaye RakersVeita Bland [address], [place]   [phone]) [phone] Only accepts [place] Access [place] patients after they have their name applied to their card.   Self-Pay (no insurance) in [facility]:  Organization         Address  Phone   Notes  Sickle Cell Patients, [facility] [address], [place] [phone]) [phone]   [facility] [address], [place] [phone](336) [phone]   [facility]  [address], [place] [phone]   [facility]/Dr. Osei-Bonsu  [address] [place] [address], [place] or [phone], [place] [phone](336) [phone] Phone number for both [place] and [place] locations is the same.  [facility] [address], [place] [phone](336) [phone]   [facility] [address] [place] [address], [place] or [address] [phone]) [phone](336) [phone]   [facility] [address], [place] [phone](336) [phone], phone; [phone]) [phone], fax Sees patients 1st and 3rd Saturday of every month.  Must not qualify for public or private insurance (i.e. Medicaid, Medicare, [place] Health Choice, Veterans' Benefits)  Household income should be no more than 200% of the poverty level The clinic cannot treat you if you are pregnant or think you are pregnant  Sexually transmitted diseases are not treated at the clinic.    Dental  Care: Organization         Address  Phone  Notes  [facility] of [facility] [address], [place] [phone]) [phone] Accepts children up to age 34 who are enrolled in [place] or [place] Health Choice; pregnant women with a Medicaid card; and children who have applied for Medicaid or [place] Health Choice, but were declined, whose parents can pay a reduced fee at time of service.  [facility] of [facility] [place]  [address], [place] [phone](336) [phone] Accepts children up to age 34 who are enrolled in [place] or [place] Health Choice; pregnant women with a Medicaid card; and children who have applied for Medicaid or [place] Health Choice, but were declined, whose parents can pay a reduced fee at time of service.  [facility]  [address], [place] (  [phone] Patients are seen by appointment only. Walk-ins are not accepted. [employer] will see patients 71 years of age and older. Monday - Tuesday (8am-5pm) Most Wednesdays (8:30-5pm) $30 per visit, cash only  [facility] Adult Dental Access PROGRAM  [address], [facility] [phone] Patients are seen by appointment only. Walk-ins are not accepted. [place] will see patients 37 years of age and older. One Wednesday Evening (Monthly: Volunteer Based).  $30 per visit, cash only  [place]  [phone] for adults; Children under age 71, call Graduate Pediatric Dentistry at [phone]. Children aged 47-14, please call [phone] to request a pediatric application.  Dental services are provided in all areas of dental care including fillings, crowns and bridges, complete and partial dentures, implants, gum treatment, root canals, and extractions. Preventive care is also provided. Treatment is provided to both adults and children. Patients are selected via a lottery and there is often a waiting list.   [facility] [address] [place]  [phone] [email or web address]   [facility] [address] [place], [place] [phone], Ext. [phone] Second and Fourth Thursday of each month, opens at 6:30 AM; Clinic ends at 9 AM.  Patients are seen on a first-come first-served basis, and a limited number are seen during each clinic.   [facility]  [address] [place] D[place]er [place], [place] [phone]   Eligibility Requirements You must have lived in [place], [place], or [place] counties for at least the last three months.   You cannot be eligible for state or federal sponsored [employer], including [employer], [place], or [employer].   You generally cannot be eligible for healthcare insurance through your employer.    How to apply: Eligibility screenings are held every Tuesday and Wednesday afternoon from 1:00 pm until 4:00 pm. You do not need an appointment for the interview!  [facility] [address] [place], [place]   [place]  [place] Department  [place]  [phone]    Behavioral Health Resources in the Community: Intensive Outpatient Programs Organization         Address  Phone  Notes  [place]. [address], [place], [place] [phone]   [facility] [address] [place], [place]   ADS: [facility] [address], [place], [place]   [place] [address]  [place], [place] or [phone]   Substance Abuse Resources Organization         Address  Phone  Notes  [facility]  [phone]   [place]  [phone]   [facility]   Chinita Pester  [phone]   Residential & Outpatient Substance Abuse Program  [phone]    Psychological Services Organization         Address  Phone  Notes  [facility]  [place]  [phone]   [place] [address], [place] or [phone]    Mobile Crisis Teams Organization         Address  Phone  Notes  [facility], Mobile Crisis Care Unit  [phone]   [facility]  [address] [place], [place]   [place] Levels [address] °[place] [phone]   ° °Self-Help/Support Groups °Organization         Address  Phone             Notes  °[facility] [place] - variety of support groups  336- [phone] Call for more information  °Narcotics Anonymous (NA), [facility] [address], °[place]  2 meetings at this location  ° °Residential Treatment Programs °Organization         Address  Phone  Notes  °[facility] [address],    °[place]  [phone]   °[facility] ° [address], [place], [place] [phone]   °[facility] [address], [place] [phone] Admissions: 8am-3pm M-F  °[facility] [address]    °[place], [place] [phone]   °[facility] [address], [place], [place] [phone]   °[facility] [address]  °[place], [place] [phone]   °[facility] - Intensive Outpatient [address], , [place] [phone]   °[facility] [address]  °[place], [place] 1-877-615-2722 or [phone]   °[facility] ([facility]) [address] , [place] [phone] Accepts Medicaid  °[facility] [address]  ° [place] 1-800-659-3381 Substance Abuse/Addiction Treatment  ° °[place] Behavioral Health Resources °Organization         Address  Phone  Notes  °[facility]  (888) [phone]   °Julie Brannon, PhD [address] [place], [place]   (336) [phone] or (336) 951-0000   ° Behavioral   [address] °[place], [place] (336) [phone]   °[facility] [address], [place], [place] (336) [phone] Insurance/Medicaid/sponsorship through [facility]  °[facility] [address]                                    [place], [place] ([phone]) [phone] Therapy/tele-psych/case  °[facility] [address]  ° [place], [place] ([phone]) [phone]    °Dr. Arfeen  (336) [phone]   °[facility] [place]  [employer] [place] [facility] 1) [address], [place] °2) [address], [place] °3)  [address], [place] (336) [phone] °(336) [phone] ° °(336) [phone]   °[place] Child Abuse Hotline (336) 342-1394 or (336) 342-3537 (After Hours)    ° ° ° ° °

## 2016-03-23 ENCOUNTER — Emergency Department (HOSPITAL_COMMUNITY)
Admission: EM | Admit: 2016-03-23 | Discharge: 2016-03-23 | Disposition: A | Discharge status: Home / Self Care | Payer: 59 | Attending: Emergency Medicine | Admitting: Emergency Medicine

## 2016-03-23 ENCOUNTER — Encounter (HOSPITAL_COMMUNITY): Payer: Self-pay

## 2016-03-23 DIAGNOSIS — K0889 Other specified disorders of teeth and supporting structures: Secondary | ICD-10-CM | POA: Diagnosis present

## 2016-03-23 DIAGNOSIS — K029 Dental caries, unspecified: Secondary | ICD-10-CM

## 2016-03-23 DIAGNOSIS — Z792 Long term (current) use of antibiotics: Secondary | ICD-10-CM | POA: Insufficient documentation

## 2016-03-23 MED ORDER — NAPROXEN 500 MG PO TABS: 500.0000 mg | ORAL_TABLET | Freq: Two times a day (BID) | ORAL | Status: DC

## 2016-03-23 MED ORDER — NAPROXEN 250 MG PO TABS
500.0000 mg | ORAL_TABLET | Freq: Once | ORAL | Status: AC
  Administered 2016-03-23: 500 mg via ORAL
  Filled 2016-03-23: qty 2

## 2016-03-23 MED ORDER — AMOXICILLIN 500 MG PO CAPS: 500.0000 mg | ORAL_CAPSULE | Freq: Two times a day (BID) | ORAL | Status: DC

## 2016-03-23 MED ORDER — HYDROCODONE-ACETAMINOPHEN 5-325 MG PO TABS: 1.0000 | ORAL_TABLET | Freq: Four times a day (QID) | ORAL | Status: DC | PRN

## 2016-03-23 NOTE — ED Provider Notes (Signed)
CSN: [id number]     Arrival date & time 03/23/16  2130 History   First MD Initiated Contact with Patient 03/23/16 2238     Chief Complaint  Patient presents with  . Dental Pain     (Consider location/radiation/quality/duration/timing/severity/associated sxs/prior Treatment) Patient is a 35 y.o. male presenting with tooth pain. The history is provided by the patient.  Dental Pain Associated symptoms: no fever, no headaches and no neck pain   Patient without complaint of pain to his right lower molars. For the past 2 days. Patient's known to have chronic dental decay. Now has dental insurance. When he was seen in January did not have any way to get seen by a dentist. No difficulty swallowing. No neck stiffness.  History reviewed. No pertinent past medical history. Past Surgical History  Procedure Laterality Date  . Knee arthroscopy w/ acl reconstruction     History reviewed. No pertinent family history. Social History  Substance Use Topics  . Smoking status: Never Smoker   . Smokeless tobacco: None  . Alcohol Use: Yes     Comment: socially    Review of Systems  Constitutional: Negative for fever.  HENT: Positive for dental problem.   Eyes: Negative for visual disturbance.  Respiratory: Negative for shortness of breath.   Cardiovascular: Negative for chest pain.  Gastrointestinal: Negative for abdominal pain.  Musculoskeletal: Negative for neck pain.  Skin: Negative for rash.  Neurological: Negative for headaches.  Hematological: Does not bruise/bleed easily.  Psychiatric/Behavioral: Negative for confusion.      Allergies  Review of patient's allergies indicates no known allergies.  Home Medications   Prior to Admission medications   Medication Sig Start Date End Date Taking? Authorizing Provider  amoxicillin (AMOXIL) 500 MG capsule Take 2 capsules (1,000 mg total) by mouth 2 (two) times daily. 07/14/14   Hayden Rasmussen, NP  amoxicillin (AMOXIL) 500 MG capsule Take 1  capsule (500 mg total) by mouth 2 (two) times daily. 03/23/16   Vanetta Mulders, MD  famotidine (PEPCID) 20 MG tablet Take 20 mg by mouth daily as needed for heartburn or indigestion.    Historical Provider, MD  HYDROcodone-acetaminophen (NORCO/VICODIN) 5-325 MG tablet Take 1 tablet by mouth every 4 (four) hours as needed. 11/19/15   Mady Gemma, PA-C  HYDROcodone-acetaminophen (NORCO/VICODIN) 5-325 MG tablet Take 1-2 tablets by mouth every 6 (six) hours as needed for moderate pain. 03/23/16   Vanetta Mulders, MD  naproxen (NAPROSYN) 500 MG tablet Take 1 tablet (500 mg total) by mouth 2 (two) times daily. 03/23/16   Vanetta Mulders, MD  penicillin v potassium (VEETID) 500 MG tablet Take 1 tablet (500 mg total) by mouth 4 (four) times daily. 11/19/15   Mady Gemma, PA-C   BP 137/102 mmHg  Pulse 65  Temp(Src) 99.1 F (37.3 C) (Oral)  Resp 16  Ht  (1.956 m)  Wt 107.701 kg  BMI 28.15 kg/m2  SpO2 100% Physical Exam  Constitutional: He is oriented to person, place, and time. He appears well-developed and well-nourished. No distress.  HENT:  Head: Normocephalic and atraumatic.  Mouth/Throat: Oropharynx is clear and moist.  Patient with extensive dental caries predominantly on his lower teeth and more so on the right than left. Final 2 molars on the right side with significant decay. Though swelling to the floor the mouth. Tenderness to palpation to one of them. No adenopathy.  Eyes: Conjunctivae and EOM are normal. Pupils are equal, round, and reactive to light.  Neck: Normal range  of motion. Neck supple.  Cardiovascular: Normal rate and regular rhythm.   Pulmonary/Chest: Effort normal and breath sounds normal. No respiratory distress.  Abdominal: Soft. Bowel sounds are normal. There is no tenderness.  Musculoskeletal: Normal range of motion.  Lymphadenopathy:    He has no cervical adenopathy.  Neurological: He is alert and oriented to person, place, and time. No cranial  nerve deficit. He exhibits normal muscle tone. Coordination normal.  Skin: Skin is warm. No erythema.  Nursing note and vitals reviewed.   ED Course  Procedures (including critical care time) Labs Review Labs Reviewed - No data to display  Imaging Review No results found. I have personally reviewed and evaluated these images and lab results as part of my medical decision-making.   EKG Interpretation None      MDM   Final diagnoses:  Toothache  Dental caries   Patient with extensive dental caries. Patient with increased pain on right side lower molars. No swelling to the floor the mouth. Patient was treated with anti-inflammatory medicine antibiotic dental referral provided.    Vanetta MuldersScott Keion Neels, MD 03/23/16 [id number]

## 2016-03-23 NOTE — ED Notes (Signed)
Patient presents with dental pain on the right side.  Tooth does not appear displaced. Was seen here about 4 months ago for similar pain and received penicillin and he does not feel it did anything. States "feels like a knot behind tooth" Patient A&Ox4

## 2016-03-23 NOTE — Discharge Instructions (Signed)
Take the medications as directed. Take an robotic for 7 days. Take the Naprosyn on a regular basis. Supplement with hydrocodone. Call the dentist in the morning for follow-up. Return for any new or worse symptoms.

## 2016-05-08 ENCOUNTER — Emergency Department (HOSPITAL_COMMUNITY)
Admission: EM | Admit: 2016-05-08 | Discharge: 2016-05-08 | Disposition: A | Discharge status: Home / Self Care | Payer: Self-pay | Attending: Emergency Medicine | Admitting: Emergency Medicine

## 2016-05-08 ENCOUNTER — Encounter (HOSPITAL_COMMUNITY): Payer: Self-pay | Admitting: Emergency Medicine

## 2016-05-08 DIAGNOSIS — F17210 Nicotine dependence, cigarettes, uncomplicated: Secondary | ICD-10-CM | POA: Insufficient documentation

## 2016-05-08 DIAGNOSIS — K029 Dental caries, unspecified: Secondary | ICD-10-CM

## 2016-05-08 MED ORDER — AMOXICILLIN 500 MG PO CAPS: 500.0000 mg | ORAL_CAPSULE | Freq: Three times a day (TID) | ORAL | Status: DC

## 2016-05-08 MED ORDER — NAPROXEN 500 MG PO TABS: 500.0000 mg | ORAL_TABLET | Freq: Two times a day (BID) | ORAL | Status: DC

## 2016-05-08 MED ORDER — HYDROCODONE-ACETAMINOPHEN 5-325 MG PO TABS: 1.0000 | ORAL_TABLET | ORAL | Status: DC | PRN

## 2016-05-08 NOTE — Discharge Instructions (Signed)
Dental Caries °Dental caries (also called tooth decay) is the most common oral disease. It can occur at any age but is more common in children and young adults.  °HOW DENTAL CARIES DEVELOPS  °The process of decay begins when bacteria and foods (particularly sugars and starches) combine in your mouth to produce plaque. Plaque is a substance that sticks to the hard, outer surface of a tooth (enamel). The bacteria in plaque produce acids that attack enamel. These acids may also attack the root surface of a tooth (cementum) if it is exposed. Repeated attacks dissolve these surfaces and create holes in the tooth (cavities). If left untreated, the acids destroy the other layers of the tooth.  °RISK FACTORS °· Frequent sipping of sugary beverages.   °· Frequent snacking on sugary and starchy foods, especially those that easily get stuck in the teeth.   °· Poor oral hygiene.   °· Dry mouth.   °· Substance abuse such as methamphetamine abuse.   °· Broken or poor-fitting dental restorations.   °· Eating disorders.   °· Gastroesophageal reflux disease (GERD).   °· Certain radiation treatments to the head and neck. °SYMPTOMS °In the early stages of dental caries, symptoms are seldom present. Sometimes white, chalky areas may be seen on the enamel or other tooth layers. In later stages, symptoms may include: °· Pits and holes on the enamel. °· Toothache after sweet, hot, or cold foods or drinks are consumed. °· Pain around the tooth. °· Swelling around the tooth. °DIAGNOSIS  °Most of the time, dental caries is detected during a regular dental checkup. A diagnosis is made after a thorough medical and dental history is taken and the surfaces of your teeth are checked for signs of dental caries. Sometimes special instruments, such as lasers, are used to check for dental caries. Dental X-ray exams may be taken so that areas not visible to the eye (such as between the contact areas of the teeth) can be checked for cavities.    °TREATMENT  °If dental caries is in its early stages, it may be reversed with a fluoride treatment or an application of a remineralizing agent at the dental office. Thorough brushing and flossing at home is needed to aid these treatments. If it is in its later stages, treatment depends on the location and extent of tooth destruction:  °· If a small area of the tooth has been destroyed, the destroyed area will be removed and cavities will be filled with a material such as gold, silver amalgam, or composite resin.   °· If a large area of the tooth has been destroyed, the destroyed area will be removed and a cap (crown) will be fitted over the remaining tooth structure.   °· If the center part of the tooth (pulp) is affected, a procedure called a root canal will be needed before a filling or crown can be placed.   °· If most of the tooth has been destroyed, the tooth may need to be pulled (extracted). °HOME CARE INSTRUCTIONS °You can prevent, stop, or reverse dental caries at home by practicing good oral hygiene. Good oral hygiene includes: °· Thoroughly cleaning your teeth at least twice a day with a toothbrush and dental floss.   °· Using a fluoride toothpaste. A fluoride mouth rinse may also be used if recommended by your dentist or health care provider.   °· Restricting the amount of sugary and starchy foods and sugary liquids you consume.   °· Avoiding frequent snacking on these foods and sipping of these liquids.   °· Keeping regular visits with   a dentist for checkups and cleanings. PREVENTION   Practice good oral hygiene.  Consider a dental sealant. A dental sealant is a coating material that is applied by your dentist to the pits and grooves of teeth. The sealant prevents food from being trapped in them. It may protect the teeth for several years.  Ask about fluoride supplements if you live in a community without fluorinated water or with water that has a low fluoride content. Use fluoride supplements  as directed by your dentist or health care provider.  Allow fluoride varnish applications to teeth if directed by your dentist or health care provider.   This information is not intended to replace advice given to you by your health care provider. Make sure you discuss any questions you have with your health care provider.   Document Released: 07/12/2002 Document Revised: 11/10/2014 Document Reviewed: 10/22/2012 Elsevier Interactive Patient Education 2016 Elsevier Inc.  Dental Extraction A dental extraction is the removal (extraction) of a tooth. You may need to have a dental extraction if:   You have tooth decay or gum disease.  You have an infection (abscess).  Room needs to be made for other teeth to grow in or to be aligned properly.  Baby (primary) teeth are preventing adult (permanent) teeth from coming to the surface (erupting).  You have a tooth fracture or fractures that are not repairable.  You are going to be having radiation to your head and neck. The type and length of procedure that you have depends on the reason for the extraction and the placement of the tooth or teeth that are being removed. The procedure may be:  A simple extraction. This is done if the tooth is visible in the mouth and is above the gumline.  A surgical extraction. This is done if the tooth has not come into the mouth or if the tooth is broken off below the gumline. LET [facility] PROVID[facility] KNOW ABOUT:  Any allergies you have.  All medicines you are taking, including vitamins, herbs, eye drops, creams, and over-the-counter medicines.  Previous problems you or members of your family have had with the use of anesthetics.  Any blood disorders you have.  Previous surgeries you have had.  Any medical conditions you may have. RISKS AND COMPLICATIONS Generally, this is a safe procedure. However, problems may occur, including:  Damage to surrounding teeth, nerves, tissues, or  structures.  The blood clot does not form or stay in place where the tooth was removed. This causes the bones and nerves underneath to be exposed (dry socket). This can delay healing.  Incomplete extraction of roots.  Jawbone injury, pain, or weakness. BEFORE THE PROCEDURE  Ask your health care provider about:  Changing or stopping your regular medicines. This is especially important if you are taking diabetes medicines or blood thinners.  Taking medicines such as aspirin and ibuprofen. These medicines can thin your blood. Do not take these medicines before your procedure if your health care provider instructs you not to.  Take medicines, such as antibiotic medicines, as directed by your health care provider.  Follow instructions from your health care provider about eating or drinking restrictions.  Plan to have someone take you home after the procedure.  If you go home right after the procedure, plan to have someone with you for 24 hours. PROCEDURE  You may be given one or more of the following:  A medicine that helps you relax (sedative).  A medicine that numbs the  area (local anesthetic).  A medicine that makes you fall asleep (general anesthetic).  If you are having a simple extraction:  Your dentist will loosen the tooth with an instrument called an elevator.  Another instrument called forceps will be used to grasp the tooth and remove it from the socket.  The open socket will be cleaned.  Gauze will be placed in the socket to reduce bleeding.  If you are having a surgical extraction:  Your dentist will make an incision in the gum.  Some of the bone around the tooth may need to be removed.  The tooth will be removed.  Stitches (sutures) may be required to close the area. The procedure may vary among health care providers and hospitals. AFTER THE PROCEDURE  You may have gauze in your mouth where the tooth was removed. If directed by your health care provider,  apply gentle pressure on the gauze for up to one hour after the procedure. This will help to control bleeding.  A blood clot should begin to form over the open socket. This is normal. Do not touch the area, and do not rinse it.  You may be given medicines to help control pain and help your recovery.   This information is not intended to replace advice given to you by your health care provider. Make sure you discuss any questions you have with your health care provider.   Document Released: 10/20/2005 Document Revised: 03/06/2015 Document Reviewed: 10/16/2014 Elsevier Interactive Patient Education 2016 Elsevier Inc. [facility] [employer]  [employer]  Community Service Learning [facility]  [address]  [place], [place] [address]  Phone [phone]  The [employer] [facility] [place] exemplifies the [employer] vision to improve the health and quality of life of all [place] by Public house managercreating leaders with a passion to care for the underserved and by leading the nation in community-based, service learning oral health education. We are committed to offering comprehensive general dental services for adults, children and special needs patients in a safe, caring and professional setting.  Appointments: Our clinic is open Monday through Friday 8:00 a.m. until 5:00 p.m. The amount of time scheduled for an appointment depends on the patients specific needs. We ask that you keep your appointed time for care or provide 24-hour notice of all appointment changes. Parents or legal guardians must accompany minor children.  Payment for Services: Medicaid and other insurance plans are welcome. Payment for services is due when services are rendered and may be made by cash or credit card. If you have dental insurance, we will assist you with your claim submission.   Emergencies:  Emergency services will be provided Monday through Friday on a walk-in basis. Please arrive early for emergency services. After hours emergency services will be provided for patients of record as required.  Services:  Medical illustratorComprehensive General Dentistry  Childrens Dentistry  Oral Surgery - Extractions  Root Canals  Sealants and Tooth Colored Fillings  Crowns and Bridges  Dentures and Partial Dentures  Implant Services  Periodontal Services and Cleanings  Cosmetic Building services engineerTooth Whitening  Digital Radiography  3-D/[facility] Be[facility] Imaging

## 2016-05-08 NOTE — ED Notes (Signed)
Patient presents with right sided dental pain x1 day. Denies difficulty swallowing, fever. Speaking in complete sentences. No relief with ibuprofen.

## 2016-05-08 NOTE — ED Provider Notes (Signed)
CSN: [id number]     Arrival date & time 05/08/16  2019 History   First MD Initiated Contact with Patient 05/08/16 2124     Chief Complaint  Patient presents with  . Dental Pain     (Consider location/radiation/quality/duration/timing/severity/associated sxs/prior Treatment) Patient is a 35 y.o. male presenting with tooth pain. The history is provided by the patient and medical records. No language interpreter was used.  Dental Pain Location:  Lower Lower teeth location:  31/RL 2nd molar and 32/RL 3rd molar Quality:  Constant and throbbing Severity:  Severe Onset quality:  Gradual Duration:  24 hours Timing:  Constant Progression:  Worsening Chronicity:  Recurrent Context: dental caries and poor dentition   Relieved by:  Nothing Worsened by:  Cold food/drink, hot food/drink, pressure and touching Ineffective treatments:  Acetaminophen and NSAIDs Associated symptoms: no congestion, no difficulty swallowing, no drooling, no facial pain, no facial swelling, no fever, no gum swelling, no headaches, no neck pain, no neck swelling, no oral bleeding, no oral lesions and no trismus   Risk factors: lack of dental care and smoking     History reviewed. No pertinent past medical history. Past Surgical History  Procedure Laterality Date  . Knee surgery     No family history on file. Social History  Substance Use Topics  . Smoking status: Current Every Day Smoker -- 1.00 packs/day    Types: Cigarettes  . Smokeless tobacco: None  . Alcohol Use: No    Review of Systems  Constitutional: Negative for fever.  HENT: Negative for congestion, drooling, facial swelling and mouth sores.   Musculoskeletal: Negative for neck pain.  Neurological: Negative for headaches.      Allergies  Review of patient's allergies indicates no known allergies.  Home Medications   Prior to Admission medications   Not on File   BP 115/81 mmHg  Pulse 77  Temp(Src) 98.5 F (36.9 C) (Oral)  Resp 18   Ht 6\' 5"  (1.956 m)  Wt 108.863 kg  BMI 28.45 kg/m2  SpO2 100% Physical Exam  Constitutional: He appears well-developed and well-nourished. No distress.  HENT:  Head: Normocephalic and atraumatic.  Poor dentition. BL upper and lower molars with marked decay, exposed pulp and dentin. The R lower 3rd molar is TTp without evidence of abscess. Oropharynx patent  Eyes: Conjunctivae are normal. No scleral icterus.  Neck: Normal range of motion. Neck supple.  Cardiovascular: Normal rate, regular rhythm and normal heart sounds.   Pulmonary/Chest: Effort normal and breath sounds normal. No respiratory distress.  Abdominal: Soft. There is no tenderness.  Musculoskeletal: He exhibits no edema.  Neurological: He is alert.  Skin: Skin is warm and dry. He is not diaphoretic.  Psychiatric: His behavior is normal.  Nursing note and vitals reviewed.   ED Course  Procedures (including critical care time) Labs Review Labs Reviewed - No data to display  Imaging Review No results found. I have personally reviewed and evaluated these images and lab results as part of my medical decision-making.   EKG Interpretation None      MDM   Final diagnoses:  Pain due to dental caries   Patient with toothache.  No gross abscess.  Exam unconcerning for Ludwig's angina or spread of infection.  Will treat with penicillin and pain medicine. NCCSRS reviewed last meds in 03/2016 Urged patient to follow-up with dentist.       Arthor CaptainAbigail Jersee Winiarski, PA-C 05/08/16 [id number]  Mancel BaleElliott Wentz, MD 05/09/16 1136

## 2016-05-09 ENCOUNTER — Encounter (HOSPITAL_COMMUNITY): Payer: Self-pay

## 2020-06-26 ENCOUNTER — Ambulatory Visit (HOSPITAL_COMMUNITY)
Admission: EM | Admit: 2020-06-26 | Discharge: 2020-06-26 | Disposition: A | Discharge status: Home / Self Care | Payer: 59 | Attending: Physician Assistant | Admitting: Physician Assistant

## 2020-06-26 ENCOUNTER — Encounter (HOSPITAL_COMMUNITY): Payer: Self-pay

## 2020-06-26 ENCOUNTER — Other Ambulatory Visit: Payer: Self-pay

## 2020-06-26 DIAGNOSIS — K047 Periapical abscess without sinus: Secondary | ICD-10-CM | POA: Diagnosis not present

## 2020-06-26 DIAGNOSIS — K029 Dental caries, unspecified: Secondary | ICD-10-CM

## 2020-06-26 MED ORDER — CHLORHEXIDINE GLUCONATE 0.12 % MT SOLN: 15.0000 mL | Freq: Two times a day (BID) | OROMUCOSAL | 0 refills | Status: DC

## 2020-06-26 MED ORDER — ACETAMINOPHEN 500 MG PO TABS: 1000.0000 mg | ORAL_TABLET | Freq: Three times a day (TID) | ORAL | 0 refills | Status: DC | PRN

## 2020-06-26 MED ORDER — NAPROXEN 500 MG PO TABS: 500.0000 mg | ORAL_TABLET | Freq: Two times a day (BID) | ORAL | 0 refills | Status: DC

## 2020-06-26 MED ORDER — AMOXICILLIN-POT CLAVULANATE 875-125 MG PO TABS: 1.0000 | ORAL_TABLET | Freq: Two times a day (BID) | ORAL | 0 refills | Status: AC

## 2020-06-26 NOTE — ED Triage Notes (Signed)
Pt c/o dental abscess in the left upper jaw of mouthx1 wk. Pt states made a dental appt, but it's 2 wks away.

## 2020-06-26 NOTE — ED Provider Notes (Signed)
[facility]    CSN: [id number] Arrival date & time: 06/26/20  5784      History   Chief Complaint Chief Complaint  Patient presents with  . Dental Pain    HPI Sean Burch is a 39 y.o. male.   Patient with known poor dental history presents for left upper dental pain with swelling.  He believes he has a small abscess there.  Reports he has had pain in his teeth for a long time.  He reports he has several broken and decaying teeth.  He reports infections in the past.  He reports over the last week to 10 days he noticed a small amount of swelling of one of the left front teeth.  Reports this is tender to touch.  Pain with chewing otherwise pain is not severe.  Denies fever and chills.  Denies difficulty swallowing.  Has dental appointment in 2 weeks.     History reviewed. No pertinent past medical history.  There are no problems to display for this patient.   Past Surgical History:  Procedure Laterality Date  . KNEE ARTHROSCOPY W/ ACL RECONSTRUCTION    . KNEE SURGERY         Home Medications    Prior to Admission medications   Medication Sig Start Date End Date Taking? Authorizing Provider  acetaminophen (TYLENOL) 500 MG tablet Take 2 tablets (1,000 mg total) by mouth every 8 (eight) hours as needed. 06/26/20   Kahlel Peake, Veryl Speak, PA-C  amoxicillin-clavulanate (AUGMENTIN) 875-125 MG tablet Take 1 tablet by mouth 2 (two) times daily for 10 days. 06/26/20 07/06/20  Raynie Steinhaus, Veryl Speak, PA-C  chlorhexidine (PERIDEX) 0.12 % solution Use as directed 15 mLs in the mouth or throat 2 (two) times daily. 06/26/20   Gaberial Cada, Veryl Speak, PA-C  famotidine (PEPCID) 20 MG tablet Take 20 mg by mouth daily as needed for heartburn or indigestion.    [provider]  naproxen (NAPROSYN) 500 MG tablet Take 1 tablet (500 mg total) by mouth 2 (two) times daily. 06/26/20   Marshia Tropea, Veryl Speak, PA-C    Family History No family history on file.  Social History Social History   Tobacco Use  .  Smoking status: Current Every Day Smoker    Packs/day: 1.00    Types: Cigarettes  Substance Use Topics  . Alcohol use: Yes    Comment: socially  . Drug use: No     Allergies   Patient has no known allergies.   Review of Systems Review of Systems   Physical Exam Triage Vital Signs ED Triage Vitals  Enc Vitals Group     BP 06/26/20 0944 118/76     Pulse Rate 06/26/20 0944 69     Resp 06/26/20 0944 18     Temp 06/26/20 0944 98.2 F (36.8 C)     Temp Source 06/26/20 0944 Oral     SpO2 06/26/20 0944 100 %     Weight 06/26/20 0947 275 lb (124.7 kg)     Height 06/26/20 0947 6\' 5"  (1.956 m)     Head Circumference --      Peak Flow --      Pain Score 06/26/20 0947 6     Pain Loc --      Pain Edu? --      Excl. in GC? --    No data found.  Updated Vital Signs BP 118/76   Pulse 69   Temp 98.2 F (36.8 C) (Oral)   Resp  18   Ht 6\' 5"  (1.956 m)   Wt 275 lb (124.7 kg)   SpO2 100%   BMI 32.61 kg/m   Visual Acuity Right Eye Distance:   Left Eye Distance:   Bilateral Distance:    Right Eye Near:   Left Eye Near:    Bilateral Near:     Physical Exam Vitals and nursing note reviewed.  Constitutional:      Appearance: Normal appearance.  HENT:     Head:     Comments: No severe facial swelling.  Tenderness to palpation of the maxillary dentition.    Mouth/Throat:      Comments: Dentition overall very poor.  Several cracked and decaying teeth.  Oropharynx clear.  No trismus. Neck:     Comments: No submandibular swelling or submental swelling. Cardiovascular:     Rate and Rhythm: Normal rate.  Pulmonary:     Effort: Pulmonary effort is normal. No respiratory distress.  Neurological:     Mental Status: He is alert.      UC Treatments / Results  Labs (all labs ordered are listed, but only abnormal results are displayed) Labs Reviewed - No data to display  EKG   Radiology No results found.  Procedures Procedures (including critical care time)   Medications Ordered in UC Medications - No data to display  Initial Impression / Assessment and Plan / UC Course  I have reviewed the triage vital signs and the nursing notes.  Pertinent labs & imaging results that were available during my care of the patient were reviewed by me and considered in my medical decision making (see chart for details).     #Dental abscess #Dental infection #Caries Patient is a 39 year old presenting with dental infection with possible early abscess.  Numerous visits in the past for similar.  Will place on Augmentin, Peridex.  Encourage somewhat sooner follow-up with dentist.  Discussed return emergency department precautions.  Patient verbalized understanding plan of care. Final Clinical Impressions(s) / UC Diagnoses   Final diagnoses:  Dental caries  Dental abscess  Dental infection     Discharge Instructions     Take the medications as prescribed  Call your dentist for sooner follow up  If swelling continues to worsen, spreads into your face, you have swelling in neck, difficulty swallowing, return or go to the Emergency department      ED Prescriptions    Medication Sig Dispense Auth. Provider   amoxicillin-clavulanate (AUGMENTIN) 875-125 MG tablet Take 1 tablet by mouth 2 (two) times daily for 10 days. 20 tablet Alencia Gordon, 20, PA-C   chlorhexidine (PERIDEX) 0.12 % solution Use as directed 15 mLs in the mouth or throat 2 (two) times daily. 120 mL Tamiah Dysart, Veryl Speak, PA-C   acetaminophen (TYLENOL) 500 MG tablet Take 2 tablets (1,000 mg total) by mouth every 8 (eight) hours as needed. 30 tablet Shaquandra Galano, Veryl Speak, PA-C   naproxen (NAPROSYN) 500 MG tablet Take 1 tablet (500 mg total) by mouth 2 (two) times daily. 30 tablet Tallan Sandoz, Veryl Speak, PA-C     PDMP not reviewed this encounter.   Veryl Speak, PA-C 06/26/20 1024

## 2020-06-26 NOTE — Discharge Instructions (Signed)
Take the medications as prescribed  Call your dentist for sooner follow up  If swelling continues to worsen, spreads into your face, you have swelling in neck, difficulty swallowing, return or go to the Emergency department

## 2020-07-23 ENCOUNTER — Encounter (HOSPITAL_COMMUNITY): Payer: Self-pay | Admitting: Emergency Medicine

## 2020-07-23 ENCOUNTER — Other Ambulatory Visit: Payer: Self-pay

## 2020-07-23 ENCOUNTER — Emergency Department (HOSPITAL_COMMUNITY)
Admission: EM | Admit: 2020-07-23 | Discharge: 2020-07-24 | Disposition: A | Discharge status: Home / Self Care | Payer: 59 | Attending: Emergency Medicine | Admitting: Emergency Medicine

## 2020-07-23 DIAGNOSIS — Z5321 Procedure and treatment not carried out due to patient leaving prior to being seen by health care provider: Secondary | ICD-10-CM | POA: Diagnosis not present

## 2020-07-23 DIAGNOSIS — R82998 Other abnormal findings in urine: Secondary | ICD-10-CM | POA: Diagnosis not present

## 2020-07-23 DIAGNOSIS — R109 Unspecified abdominal pain: Secondary | ICD-10-CM | POA: Insufficient documentation

## 2020-07-23 DIAGNOSIS — R110 Nausea: Secondary | ICD-10-CM | POA: Diagnosis not present

## 2020-07-23 LAB — URINALYSIS, ROUTINE W REFLEX MICROSCOPIC
Bacteria, UA: NONE SEEN
Bilirubin Urine: NEGATIVE
Glucose, UA: NEGATIVE mg/dL
Ketones, ur: NEGATIVE mg/dL
Leukocytes,Ua: NEGATIVE
Nitrite: NEGATIVE
Protein, ur: NEGATIVE mg/dL
Specific Gravity, Urine: 1.016 (ref 1.005–1.030)
pH: 5 (ref 5.0–8.0)

## 2020-07-23 LAB — COMPREHENSIVE METABOLIC PANEL
ALT: 63 U/L — ABNORMAL HIGH (ref 0–44)
AST: 41 U/L (ref 15–41)
Albumin: 3.8 g/dL (ref 3.5–5.0)
Alkaline Phosphatase: 222 U/L — ABNORMAL HIGH (ref 38–126)
Anion gap: 11 (ref 5–15)
BUN: 5 mg/dL — ABNORMAL LOW (ref 6–20)
CO2: 26 mmol/L (ref 22–32)
Calcium: 9 mg/dL (ref 8.9–10.3)
Chloride: 102 mmol/L (ref 98–111)
Creatinine, Ser: 0.93 mg/dL (ref 0.61–1.24)
GFR calc Af Amer: >60 mL/min (ref >60)
GFR calc non Af Amer: >60 mL/min (ref >60)
Glucose, Bld: 115 mg/dL — ABNORMAL HIGH (ref 70–99)
Potassium: 3.7 mmol/L (ref 3.5–5.1)
Sodium: 139 mmol/L (ref 135–145)
Total Bilirubin: 0.9 mg/dL (ref 0.3–1.2)
Total Protein: 7.5 g/dL (ref 6.5–8.1)

## 2020-07-23 LAB — CBC
HCT: 45.8 % (ref 39.0–52.0)
Hemoglobin: 14.4 g/dL (ref 13.0–17.0)
MCH: 26.1 pg (ref 26.0–34.0)
MCHC: 31.4 g/dL (ref 30.0–36.0)
MCV: 83 fL (ref 80.0–100.0)
Platelets: 199 10*3/uL (ref 150–400)
RBC: 5.52 MIL/uL (ref 4.22–5.81)
RDW: 14.4 % (ref 11.5–15.5)
WBC: 6.7 10*3/uL (ref 4.0–10.5)
nRBC: 0 % (ref 0.0–0.2)

## 2020-07-23 LAB — LIPASE, BLOOD: Lipase: 28 U/L (ref 11–51)

## 2020-07-23 NOTE — ED Triage Notes (Signed)
Patient with flank and abdominal pain for the last few days.  He states that he does drink a lot of soda.  He states that he has some dark urine.  Patient does have some nausea, no vomiting.

## 2020-07-24 ENCOUNTER — Ambulatory Visit (INDEPENDENT_AMBULATORY_CARE_PROVIDER_SITE_OTHER): Payer: 59

## 2020-07-24 ENCOUNTER — Encounter (HOSPITAL_COMMUNITY): Payer: Self-pay

## 2020-07-24 ENCOUNTER — Other Ambulatory Visit: Payer: Self-pay

## 2020-07-24 ENCOUNTER — Ambulatory Visit (HOSPITAL_COMMUNITY)
Admission: EM | Admit: 2020-07-24 | Discharge: 2020-07-24 | Disposition: A | Discharge status: Home / Self Care | Payer: 59 | Attending: Family Medicine | Admitting: Family Medicine

## 2020-07-24 DIAGNOSIS — R1031 Right lower quadrant pain: Secondary | ICD-10-CM

## 2020-07-24 DIAGNOSIS — M545 Low back pain: Secondary | ICD-10-CM | POA: Diagnosis not present

## 2020-07-24 DIAGNOSIS — R1032 Left lower quadrant pain: Secondary | ICD-10-CM | POA: Diagnosis not present

## 2020-07-24 DIAGNOSIS — R109 Unspecified abdominal pain: Secondary | ICD-10-CM | POA: Diagnosis not present

## 2020-07-24 HISTORY — DX: Childhood onset fluency disorder: F80.81

## 2020-07-24 LAB — POCT URINALYSIS DIPSTICK, ED / UC
Bilirubin Urine: NEGATIVE
Glucose, UA: NEGATIVE mg/dL
Ketones, ur: NEGATIVE mg/dL
Leukocytes,Ua: NEGATIVE
Nitrite: NEGATIVE
Protein, ur: NEGATIVE mg/dL
Specific Gravity, Urine: 1.015 (ref 1.005–1.030)
Urobilinogen, UA: 1 mg/dL (ref 0.0–1.0)
pH: 6 (ref 5.0–8.0)

## 2020-07-24 NOTE — Discharge Instructions (Addendum)
Nothing concerning on exam today.  I believe this may have been a kidney stone that has passed Make sure that you are drinking plenty of water.  Decrease caffeine.  I am giving you contact for primary care and urology for follow up.  You ,may  need some repeat blood work and imaging of the abdomen.  Follow up as needed for continued or worsening symptoms

## 2020-07-24 NOTE — ED Notes (Signed)
Pt did not answer x 3 

## 2020-07-24 NOTE — ED Triage Notes (Signed)
Pt c/o 3/10 burning pain in lower back that radiates to RLQ, LLQ of abdomenx1 wk. Pt states he feels dehydrated and that he drinks a lot of pop. Pt denies N/V/D, constipation.

## 2020-07-25 NOTE — ED Provider Notes (Signed)
[facility]    CSN: [id number] Arrival date & time: 07/24/20  1749      History   Chief Complaint Chief Complaint  Patient presents with  . Abdominal Pain    HPI Sean Burch is a 39 y.o. male.   Patient is a 39 year old male with no significant past medical history.  He presents today for evaluation burning pain and lower back that radiates into right and left lower quadrant groin area.  This has been off and on for the past week.  Feels like he is dehydrated.  Patient reports drinking other male new daily and no water intake.  Since this started he has been increasing his water which seems to help with symptoms.  Denies any nausea, vomiting, diarrhea, constipation, fevers.  No noticeable blood in urine or dysuria.  Noted looking at lab work done prior to here from ER patient with elevated alkaline phosphatase and AST.  Patient denies any alcohol use.  Reporting does eat diet high in fat and mostly fried.      Past Medical History:  Diagnosis Date  . Stuttering     There are no problems to display for this patient.   Past Surgical History:  Procedure Laterality Date  . KNEE ARTHROSCOPY W/ ACL RECONSTRUCTION    . KNEE SURGERY         Home Medications    Prior to Admission medications   Medication Sig Start Date End Date Taking? Authorizing Provider  acetaminophen (TYLENOL) 500 MG tablet Take 2 tablets (1,000 mg total) by mouth every 8 (eight) hours as needed. 06/26/20   Darr, Veryl Speak, PA-C  famotidine (PEPCID) 20 MG tablet Take 20 mg by mouth daily as needed for heartburn or indigestion.  07/24/20  [provider]    Family History No family history on file.  Social History Social History   Tobacco Use  . Smoking status: Current Every Day Smoker    Packs/day: 1.00    Types: Cigarettes  . Smokeless tobacco: Never Used  Substance Use Topics  . Alcohol use: Yes    Comment: socially  . Drug use: No     Allergies   Patient has no known  allergies.   Review of Systems Review of Systems   Physical Exam Triage Vital Signs ED Triage Vitals  Enc Vitals Group     BP 07/24/20 1026 130/81     Pulse Rate 07/24/20 1026 96     Resp 07/24/20 1026 16     Temp 07/24/20 1026 97.9 F (36.6 C)     Temp Source 07/24/20 1026 Oral     SpO2 07/24/20 1026 98 %     Weight 07/24/20 1027 275 lb (124.7 kg)     Height 07/24/20 1027 6\' 5"  (1.956 m)     Head Circumference --      Peak Flow --      Pain Score 07/24/20 1027 3     Pain Loc --      Pain Edu? --      Excl. in GC? --    No data found.  Updated Vital Signs BP 130/81   Pulse 96   Temp 97.9 F (36.6 C) (Oral)   Resp 16   Ht 6\' 5"  (1.956 m)   Wt 275 lb (124.7 kg)   SpO2 98%   BMI 32.61 kg/m   Visual Acuity Right Eye Distance:   Left Eye Distance:   Bilateral Distance:    Right Eye  Near:   Left Eye Near:    Bilateral Near:     Physical Exam Vitals and nursing note reviewed.  Constitutional:      Appearance: Normal appearance.  HENT:     Head: Normocephalic and atraumatic.     Nose: Nose normal.  Eyes:     Conjunctiva/sclera: Conjunctivae normal.  Pulmonary:     Effort: Pulmonary effort is normal.  Abdominal:     Palpations: Abdomen is soft.     Tenderness: There is generalized abdominal tenderness. There is no right CVA tenderness or left CVA tenderness.     Comments: No specific tenderness on exam.  No guarding or rebound.  Negative Murphy's  Musculoskeletal:        General: Normal range of motion.     Cervical back: Normal range of motion.  Skin:    General: Skin is warm and dry.  Neurological:     Mental Status: He is alert.  Psychiatric:        Mood and Affect: Mood normal.      UC Treatments / Results  Labs (all labs ordered are listed, but only abnormal results are displayed) Labs Reviewed  POCT URINALYSIS DIPSTICK, ED / UC - Abnormal; Notable for the following components:      Result Value   Hgb urine dipstick MODERATE (*)    All  other components within normal limits    EKG   Radiology DG Abd 2 Views  Result Date: 07/24/2020 CLINICAL DATA:  Abdominal pain and low back pain radiating to right and left lower quadrants 1 week. Possible kidney stone. EXAM: X-RAY ABDOMEN 3 VIEWS COMPARISON:  None. FINDINGS: Bowel gas pattern is nonobstructive. No free peritoneal air. No definite calcifications over the kidneys or ureters. Minimal degenerate change of the hips. IMPRESSION: 1. No acute findings. 2. Nonobstructive bowel gas pattern. Electronically Signed   By: Elberta Fortis M.D.   On: 07/24/2020 11:02    Procedures Procedures (including critical care time)  Medications Ordered in UC Medications - No data to display  Initial Impression / Assessment and Plan / UC Course  I have reviewed the triage vital signs and the nursing notes.  Pertinent labs & imaging results that were available during my care of the patient were reviewed by me and considered in my medical decision making (see chart for details).     Flank pain, bilateral that has seemed to improve.  Patient with moderate hemoglobin on urine Patient also having some mild hip pain.  X-ray did show some degenerative hip changes. High suspicious for kidney stone that has already passed.  He does have a lot of caffeine intake, soda intake and does not drink much water. No signs of infection. Lab work from the ER reviewed and revealed elevated AST and alkaline phosphatase.  No specific right upper quadrant pain or positive Murphy sign on exam.  Patient could have underlying gallbladder issue, gallstones. Patient does not drink alcohol Do not feel this is connected to current symptoms. X-ray without any acute findings. Do not see need to send back to the ER at this time.  We will have him follow with primary care and have all lab work redrawn and possible imaging of the abdominal area done in the next 4 weeks or so.  Patient understanding and agreed to plan. Also  given contact for possible follow-up with urology.  Final Clinical Impressions(s) / UC Diagnoses   Final diagnoses:  Flank pain     Discharge Instructions  Nothing concerning on exam today.  I believe this may have been a kidney stone that has passed Make sure that you are drinking plenty of water.  Decrease caffeine.  I am giving you contact for primary care and urology for follow up.  You ,may  need some repeat blood work and imaging of the abdomen.  Follow up as needed for continued or worsening symptoms      ED Prescriptions    None     PDMP not reviewed this encounter.   Dahlia Byes A, NP 07/25/20 1409

## 2020-08-16 ENCOUNTER — Ambulatory Visit: Payer: Self-pay | Admitting: Family Medicine

## 2020-08-17 ENCOUNTER — Encounter: Payer: Self-pay | Admitting: Family Medicine

## 2020-08-31 ENCOUNTER — Other Ambulatory Visit: Payer: Self-pay | Admitting: Nurse Practitioner

## 2020-08-31 DIAGNOSIS — K829 Disease of gallbladder, unspecified: Secondary | ICD-10-CM

## 2020-09-07 ENCOUNTER — Ambulatory Visit
Admission: RE | Admit: 2020-09-07 | Discharge: 2020-09-07 | Disposition: A | Discharge status: Home / Self Care | Payer: 59 | Source: Ambulatory Visit | Attending: Nurse Practitioner | Admitting: Nurse Practitioner

## 2020-09-07 DIAGNOSIS — K829 Disease of gallbladder, unspecified: Secondary | ICD-10-CM

## 2020-09-17 ENCOUNTER — Other Ambulatory Visit (HOSPITAL_COMMUNITY): Payer: Self-pay | Admitting: Gastroenterology

## 2020-09-17 DIAGNOSIS — R748 Abnormal levels of other serum enzymes: Secondary | ICD-10-CM

## 2020-10-02 ENCOUNTER — Other Ambulatory Visit: Payer: Self-pay | Admitting: Student

## 2020-10-03 ENCOUNTER — Other Ambulatory Visit: Payer: Self-pay

## 2020-10-03 ENCOUNTER — Ambulatory Visit (HOSPITAL_COMMUNITY)
Admission: RE | Admit: 2020-10-03 | Discharge: 2020-10-03 | Disposition: A | Discharge status: Home / Self Care | Payer: 59 | Source: Ambulatory Visit | Attending: Gastroenterology | Admitting: Gastroenterology

## 2020-10-03 DIAGNOSIS — R748 Abnormal levels of other serum enzymes: Secondary | ICD-10-CM | POA: Diagnosis not present

## 2020-10-03 DIAGNOSIS — K7400 Hepatic fibrosis, unspecified: Secondary | ICD-10-CM | POA: Diagnosis not present

## 2020-10-03 DIAGNOSIS — K760 Fatty (change of) liver, not elsewhere classified: Secondary | ICD-10-CM | POA: Diagnosis not present

## 2020-10-03 LAB — CBC
HCT: 44.9 % (ref 39.0–52.0)
Hemoglobin: 14.9 g/dL (ref 13.0–17.0)
MCH: 26.8 pg (ref 26.0–34.0)
MCHC: 33.2 g/dL (ref 30.0–36.0)
MCV: 80.9 fL (ref 80.0–100.0)
Platelets: 236 10*3/uL (ref 150–400)
RBC: 5.55 MIL/uL (ref 4.22–5.81)
RDW: 15 % (ref 11.5–15.5)
WBC: 6.9 10*3/uL (ref 4.0–10.5)
nRBC: 0 % (ref 0.0–0.2)

## 2020-10-03 LAB — PROTIME-INR
INR: 1 (ref 0.8–1.2)
Prothrombin Time: 13.2 seconds (ref 11.4–15.2)

## 2020-10-03 MED ORDER — FENTANYL CITRATE (PF) 100 MCG/2ML IJ SOLN
INTRAMUSCULAR | Status: AC | PRN
  Administered 2020-10-03: 50 ug via INTRAVENOUS

## 2020-10-03 MED ORDER — MIDAZOLAM HCL 2 MG/2ML IJ SOLN
INTRAMUSCULAR | Status: AC | PRN
  Administered 2020-10-03: 1 mg via INTRAVENOUS

## 2020-10-03 MED ORDER — MIDAZOLAM HCL 2 MG/2ML IJ SOLN
INTRAMUSCULAR | Status: AC
  Filled 2020-10-03: qty 2

## 2020-10-03 MED ORDER — SODIUM CHLORIDE 0.9 % IV SOLN: INTRAVENOUS | Status: DC

## 2020-10-03 MED ORDER — SODIUM CHLORIDE 0.9 % IV SOLN
INTRAVENOUS | Status: AC | PRN
  Administered 2020-10-03: 250 mL via INTRAVENOUS

## 2020-10-03 MED ORDER — LIDOCAINE HCL (PF) 1 % IJ SOLN
INTRAMUSCULAR | Status: AC
  Filled 2020-10-03: qty 30

## 2020-10-03 MED ORDER — FENTANYL CITRATE (PF) 100 MCG/2ML IJ SOLN
INTRAMUSCULAR | Status: AC
  Filled 2020-10-03: qty 2

## 2020-10-03 MED ORDER — GELATIN ABSORBABLE 12-7 MM EX MISC
CUTANEOUS | Status: AC
  Filled 2020-10-03: qty 1

## 2020-10-03 NOTE — Procedures (Signed)
Interventional Radiology Procedure Note  Procedure: US guided biopsy of liver for medical liver bx. Complications: None EBL: None Recommendations: - Bedrest 2 hours.   - Routine wound care - Follow up pathology - Advance diet   Signed,  Gilmer Mor, DO

## 2020-10-03 NOTE — Sedation Documentation (Signed)
Attempt x 1 to call report to Short Stay. RN not available at this time to give report. RN will call back to obtain report.

## 2020-10-03 NOTE — Discharge Instructions (Addendum)
Liver Biopsy, Care After These instructions give you information on caring for yourself after your procedure. Your doctor may also give you more specific instructions. Call your doctor if you have any problems or questions after your procedure. What can I expect after the procedure? After the procedure, it is common to have:  Pain and soreness where the biopsy was done.  Bruising around the area where the biopsy was done.  Sleepiness and be tired for a few days. Follow these instructions at home: Medicines  Take over-the-counter and prescription medicines only as told by your doctor.  If you were prescribed an antibiotic medicine, take it as told by your doctor. Do not stop taking the antibiotic even if you start to feel better.  Do not take medicines such as aspirin and ibuprofen. These medicines can thin your blood. Do not take these medicines unless your doctor tells you to take them.  If you are taking prescription pain medicine, take actions to prevent or treat constipation. Your doctor may recommend that you: ? Drink enough fluid to keep your pee (urine) clear or pale yellow. ? Take over-the-counter or prescription medicines. ? Eat foods that are high in fiber, such as fresh fruits and vegetables, whole grains, and beans. ? Limit foods that are high in fat and processed sugars, such as fried and sweet foods. Caring for your cut  Follow instructions from your doctor about how to take care of your cuts from surgery (incisions). Make sure you: ? Wash your hands with soap and water before you change your bandage (dressing). If you cannot use soap and water, use hand sanitizer. ? Change your bandage as told by your doctor. ? Leave stitches (sutures), skin glue, or skin tape (adhesive) strips in place. They may need to stay in place for 2 weeks or longer. If tape strips get loose and curl up, you may trim the loose edges. Do not remove tape strips completely unless your doctor says it is  okay.  Check your cuts every day for signs of infection. Check for: ? Redness, swelling, or more pain. ? Fluid or blood. ? Pus or a bad smell. ? Warmth.  Do not take baths, swim, or use a hot tub until your doctor says it is okay to do so. Activity   Rest at home for 1-2 days or as told by your doctor. ? Avoid sitting for a long time without moving. Get up to take short walks every 1-2 hours.  Return to your normal activities as told by your doctor. Ask what activities are safe for you.  Do not do these things in the first 24 hours: ? Drive. ? Use machinery. ? Take a bath or shower.  Do not lift more than 10 pounds (4.5 kg) or play contact sports for the first 2 weeks. General instructions   Do not drink alcohol in the first week after the procedure.  Have someone stay with you for at least 24 hours after the procedure.  Get your test results. Ask your doctor or the department that is doing the test: ? When will my results be ready? ? How will I get my results? ? What are my treatment options? ? What other tests do I need? ? What are my next steps?  Keep all follow-up visits as told by your doctor. This is important. Contact a doctor if:  A cut bleeds and leaves more than just a small spot of blood.  A cut is red, puffs up (  swells), or hurts more than before.  Fluid or something else comes from a cut.  A cut smells bad.  You have a fever or chills. Get help right away if:  You have swelling, bloating, or pain in your belly (abdomen).  You get dizzy or faint.  You have a rash.  You feel sick to your stomach (nauseous) or throw up (vomit).  You have trouble breathing, feel short of breath, or feel faint.  Your chest hurts.  You have problems talking or seeing.  You have trouble with your balance or moving your arms or legs. Summary  After the procedure, it is common to have pain, soreness, bruising, and tiredness.  Your doctor will tell you how to  take care of yourself at home. Change your bandage, take your medicines, and limit your activities as told by your doctor.  Call your doctor if you have symptoms of infection. Get help right away if your belly swells, your cut bleeds a lot, or you have trouble talking or breathing. This information is not intended to replace advice given to you by your health care provider. Make sure you discuss any questions you have with your health care provider. Document Revised: 10/30/2017 Document Reviewed: 10/30/2017 Elsevier Patient Education  2020 Elsevier Inc. Moderate Conscious Sedation, Adult Sedation is the use of medicines to promote relaxation and relieve discomfort and anxiety. Moderate conscious sedation is a type of sedation. Under moderate conscious sedation, you are less alert than normal, but you are still able to respond to instructions, touch, or both. Moderate conscious sedation is used during short medical and dental procedures. It is milder than deep sedation, which is a type of sedation under which you cannot be easily woken up. It is also milder than general anesthesia, which is the use of medicines to make you unconscious. Moderate conscious sedation allows you to return to your regular activities sooner. Tell a health care provider about:  Any allergies you have.  All medicines you are taking, including vitamins, herbs, eye drops, creams, and over-the-counter medicines.  Use of steroids (by mouth or creams).  Any problems you or family members have had with sedatives and anesthetic medicines.  Any blood disorders you have.  Any surgeries you have had.  Any medical conditions you have, such as sleep apnea.  Whether you are pregnant or may be pregnant.  Any use of cigarettes, alcohol, marijuana, or street drugs. What are the risks? Generally, this is a safe procedure. However, problems may occur, including:  Getting too much medicine (oversedation).  Nausea.  Allergic  reaction to medicines.  Trouble breathing. If this happens, a breathing tube may be used to help with breathing. It will be removed when you are awake and breathing on your own.  Heart trouble.  Lung trouble. What happens before the procedure? Staying hydrated Follow instructions from your health care provider about hydration, which may include:  Up to 2 hours before the procedure - you may continue to drink clear liquids, such as water, clear fruit juice, black coffee, and plain tea. Eating and drinking restrictions Follow instructions from your health care provider about eating and drinking, which may include:  8 hours before the procedure - stop eating heavy meals or foods such as meat, fried foods, or fatty foods.  6 hours before the procedure - stop eating light meals or foods, such as toast or cereal.  6 hours before the procedure - stop drinking milk or drinks that contain milk.  2   hours before the procedure - stop drinking clear liquids. Medicine Ask your health care provider about:  Changing or stopping your regular medicines. This is especially important if you are taking diabetes medicines or blood thinners.  Taking medicines such as aspirin and ibuprofen. These medicines can thin your blood. Do not take these medicines before your procedure if your health care provider instructs you not to.  Tests and exams  You will have a physical exam.  You may have blood tests done to show: ? How well your kidneys and liver are working. ? How well your blood can clot. General instructions  Plan to have someone take you home from the hospital or clinic.  If you will be going home right after the procedure, plan to have someone with you for 24 hours. What happens during the procedure?  An IV tube will be inserted into one of your veins.  Medicine to help you relax (sedative) will be given through the IV tube.  The medical or dental procedure will be performed. What  happens after the procedure?  Your blood pressure, heart rate, breathing rate, and blood oxygen level will be monitored often until the medicines you were given have worn off.  Do not drive for 24 hours. This information is not intended to replace advice given to you by your health care provider. Make sure you discuss any questions you have with your health care provider. Document Revised: 10/02/2017 Document Reviewed: 02/09/2016 Elsevier Patient Education  2020 Elsevier Inc.  

## 2020-10-03 NOTE — H&P (Signed)
Chief Complaint: Patient was seen in consultation today for a random liver biopsy   Referring Physician(s): Jeani Hawking  Supervising Physician: Dr. Gilmer Mor  Patient Status: [facility] - Out-pt  History of Present Illness: Sean Burch is a 39 y.o. Burch with no significant past medical history. During an urgent care work up 07/24/20 for abdominal pain lab work revealed elevated AST and alkaline phosphatase levels. An abdominal ultrasound 09/08/20 was unremarkable. The patient is followed by gastroenterology and his alkaline phosphatase levels have remained elevated.   Interventional Radiology has been asked to evaluate this patient for an image-guided random liver biopsy for further work-up and evaluation.   Past Medical History:  Diagnosis Date  . Stuttering     Past Surgical History:  Procedure Laterality Date  . KNEE ARTHROSCOPY W/ ACL RECONSTRUCTION    . KNEE SURGERY      Allergies: Patient has no known allergies.  Medications: Prior to Admission medications   Medication Sig Start Date End Date Taking? Authorizing Provider  Ascorbic Acid (VITAMIN C PO) Take 1 tablet by mouth daily.   Yes [provider]  OVER THE COUNTER MEDICATION Take 1 tablet by mouth daily. Seamoss otc supplement   Yes [provider]  VITAMIN D PO Take 1 tablet by mouth daily.   Yes [provider]  acetaminophen (TYLENOL) 500 MG tablet Take 2 tablets (1,000 mg total) by mouth every 8 (eight) hours as needed. Patient not taking: Reported on 09/26/2020 06/26/20   Darr, Gerilyn Pilgrim, PA-C  ibuprofen (ADVIL) 200 MG tablet Take 800 mg by mouth every 6 (six) hours as needed for headache or moderate pain.    [provider]  famotidine (PEPCID) 20 MG tablet Take 20 mg by mouth daily as needed for heartburn or indigestion.  07/24/20  [provider]     No family history on file.  Social History   Socioeconomic History  . Marital status: Single    Spouse name:  Not on file  . Number of children: Not on file  . Years of education: Not on file  . Highest education level: Not on file  Occupational History  . Not on file  Tobacco Use  . Smoking status: Current Every Day Smoker    Packs/day: 1.00    Types: Cigarettes  . Smokeless tobacco: Never Used  Substance and Sexual Activity  . Alcohol use: Yes    Comment: socially  . Drug use: No  . Sexual activity: Not on file  Other Topics Concern  . Not on file  Social History Narrative   ** Merged History Encounter **       Social Determinants of Health   Financial Resource Strain:   . Difficulty of Paying Living Expenses: Not on file  Food Insecurity:   . Worried About Programme researcher, broadcasting/film/video in the Last Year: Not on file  . Ran Out of Food in the Last Year: Not on file  Transportation Needs:   . Lack of Transportation (Medical): Not on file  . Lack of Transportation (Non-Medical): Not on file  Physical Activity:   . Days of Exercise per Week: Not on file  . Minutes of Exercise per Session: Not on file  Stress:   . Feeling of Stress : Not on file  Social Connections:   . Frequency of Communication with Friends and Family: Not on file  . Frequency of Social Gatherings with Friends and Family: Not on file  . Attends Religious Services: Not  on file  . Active Member of Clubs or Organizations: Not on file  . Attends Banker Meetings: Not on file  . Marital Status: Not on file    Review of Systems: A 12 point ROS discussed and pertinent positives are indicated in the HPI above.  All other systems are negative.  Review of Systems  Constitutional: Negative for appetite change and fatigue.  Respiratory: Negative for cough and shortness of breath.   Cardiovascular: Positive for palpitations. Negative for chest pain and leg swelling.       Occasional  Gastrointestinal: Positive for constipation. Negative for nausea and vomiting.       Occasional  Neurological: Negative for  light-headedness and headaches.    Vital Signs: BP 140/85   Pulse 88   Temp 98.9 F (37.2 C) (Oral)   Ht 6\' 5"  (1.956 m)   Wt 255 lb (115.7 kg)   SpO2 99%   BMI 30.24 kg/m   Physical Exam Constitutional:      General: He is not in acute distress.    Appearance: He is not ill-appearing.  HENT:     Mouth/Throat:     Mouth: Mucous membranes are moist.     Pharynx: Oropharynx is clear.  Cardiovascular:     Rate and Rhythm: Normal rate and regular rhythm.     Pulses: Normal pulses.     Heart sounds: Normal heart sounds.  Pulmonary:     Effort: Pulmonary effort is normal.     Breath sounds: Normal breath sounds.  Abdominal:     General: Abdomen is flat.     Palpations: Abdomen is soft.     Tenderness: There is no abdominal tenderness.  Musculoskeletal:        General: Normal range of motion.  Skin:    General: Skin is warm and dry.  Neurological:     Mental Status: He is alert and oriented to person, place, and time.     Imaging: Abdomen Complete  Result Date: 09/08/2020 CLINICAL DATA:  Gallbladder disease. EXAM: ABDOMEN ULTRASOUND COMPLETE COMPARISON:  None. FINDINGS: Gallbladder: No gallstones or wall thickening visualized. No sonographic Murphy sign noted by sonographer. Common bile duct: Diameter: 0.5 cm, within normal limits Liver: No focal lesion identified. Within normal limits in parenchymal echogenicity. Portal vein is patent on color Doppler imaging with normal direction of blood flow towards the liver. IVC: No abnormality visualized. Pancreas: Visualized portion unremarkable. Spleen: Size and appearance within normal limits. Right Kidney: Length: 12.5 cm. Echogenicity within normal limits. No mass or hydronephrosis visualized. Left Kidney: Length: 12.0 cm. Echogenicity within normal limits. No mass or hydronephrosis visualized. Abdominal aorta: No aneurysm visualized. Other findings: None. IMPRESSION: Unremarkable sonographic evaluation of the abdomen. Normal  appearance of the gallbladder. Electronically Signed   By: 13/04/2020 M.D.   On: 09/08/2020 19:14    Labs:  CBC: Recent Labs    07/23/20 2121 10/03/20 1109  WBC 6.7 6.9  HGB 14.4 14.9  HCT 45.8 44.9  PLT 199 236    COAGS: Recent Labs    10/03/20 1109  INR 1.0    BMP: Recent Labs    07/23/20 2121  NA 139  K 3.7  CL 102  CO2 26  GLUCOSE 115*  BUN 5*  CALCIUM 9.0  CREATININE 0.93  GFRNONAA >60  GFRAA >60    LIVER FUNCTION TESTS: Recent Labs    07/23/20 2121  BILITOT 0.9  AST 41  ALT 63*  ALKPHOS 222*  PROT 7.5  ALBUMIN 3.8    TUMOR MARKERS: No results for input(s): AFPTM, CEA, CA199, CHROMGRNA in the last 8760 hours.  Assessment and Plan:  Elevated alkaline phosphatase levels: Sean Burch. Sean Burch, 39 year old Burch, presents today to the [facility] Interventional Radiology department for an image-guided random liver biopsy.  Risks and benefits of this procedure were discussed with the patient and/or patient's family including, but not limited to bleeding, infection, damage to adjacent structures or low yield requiring additional tests.  All of the questions were answered and there is agreement to proceed.  The patient has been NPO. Labs and vitals have been reviewed. He does not take any anticoagulants.   Consent signed and in chart.  Thank you for this interesting consult.  I greatly enjoyed meeting Sean Burch and look forward to participating in their care.  A copy of this report was sent to the requesting provider on this date.  Electronically Signed: Alwyn Ren, AGACNP-BC [phone] 10/03/2020, 12:29 PM   I spent a total of  30 Minutes   in face to face in clinical consultation, greater than 50% of which was counseling/coordinating care for image-guided random liver biopsy

## 2020-10-04 ENCOUNTER — Other Ambulatory Visit: Payer: Self-pay | Admitting: Physician Assistant

## 2020-10-05 LAB — SURGICAL PATHOLOGY

## 2020-11-07 ENCOUNTER — Encounter: Payer: Self-pay | Admitting: General Practice

## 2020-12-15 ENCOUNTER — Emergency Department (HOSPITAL_BASED_OUTPATIENT_CLINIC_OR_DEPARTMENT_OTHER): Payer: 59

## 2020-12-15 ENCOUNTER — Telehealth: Payer: Self-pay | Admitting: Critical Care Medicine

## 2020-12-15 ENCOUNTER — Other Ambulatory Visit: Payer: Self-pay

## 2020-12-15 ENCOUNTER — Emergency Department (HOSPITAL_BASED_OUTPATIENT_CLINIC_OR_DEPARTMENT_OTHER)
Admission: EM | Admit: 2020-12-15 | Discharge: 2020-12-15 | Disposition: A | Discharge status: Home / Self Care | Payer: 59 | Attending: Emergency Medicine | Admitting: Emergency Medicine

## 2020-12-15 ENCOUNTER — Encounter (HOSPITAL_BASED_OUTPATIENT_CLINIC_OR_DEPARTMENT_OTHER): Payer: Self-pay | Admitting: Emergency Medicine

## 2020-12-15 DIAGNOSIS — R591 Generalized enlarged lymph nodes: Secondary | ICD-10-CM

## 2020-12-15 DIAGNOSIS — R0602 Shortness of breath: Secondary | ICD-10-CM | POA: Diagnosis not present

## 2020-12-15 DIAGNOSIS — R0789 Other chest pain: Secondary | ICD-10-CM | POA: Insufficient documentation

## 2020-12-15 DIAGNOSIS — R5383 Other fatigue: Secondary | ICD-10-CM | POA: Insufficient documentation

## 2020-12-15 DIAGNOSIS — F17210 Nicotine dependence, cigarettes, uncomplicated: Secondary | ICD-10-CM | POA: Diagnosis not present

## 2020-12-15 DIAGNOSIS — R112 Nausea with vomiting, unspecified: Secondary | ICD-10-CM | POA: Insufficient documentation

## 2020-12-15 DIAGNOSIS — R109 Unspecified abdominal pain: Secondary | ICD-10-CM | POA: Diagnosis not present

## 2020-12-15 LAB — CBC WITH DIFFERENTIAL/PLATELET
Abs Immature Granulocytes: 0.02 10*3/uL (ref 0.00–0.07)
Basophils Absolute: 0 10*3/uL (ref 0.0–0.1)
Basophils Relative: 1 %
Eosinophils Absolute: 0.1 10*3/uL (ref 0.0–0.5)
Eosinophils Relative: 2 %
HCT: 43.9 % (ref 39.0–52.0)
Hemoglobin: 14.5 g/dL (ref 13.0–17.0)
Immature Granulocytes: 0 %
Lymphocytes Relative: 28 %
Lymphs Abs: 1.7 10*3/uL (ref 0.7–4.0)
MCH: 26.6 pg (ref 26.0–34.0)
MCHC: 33 g/dL (ref 30.0–36.0)
MCV: 80.6 fL (ref 80.0–100.0)
Monocytes Absolute: 0.7 10*3/uL (ref 0.1–1.0)
Monocytes Relative: 12 %
Neutro Abs: 3.5 10*3/uL (ref 1.7–7.7)
Neutrophils Relative %: 57 %
Platelets: 224 10*3/uL (ref 150–400)
RBC: 5.45 MIL/uL (ref 4.22–5.81)
RDW: 15.1 % (ref 11.5–15.5)
WBC: 6.2 10*3/uL (ref 4.0–10.5)
nRBC: 0 % (ref 0.0–0.2)

## 2020-12-15 LAB — BASIC METABOLIC PANEL
Anion gap: 9 (ref 5–15)
BUN: 10 mg/dL (ref 6–20)
CO2: 24 mmol/L (ref 22–32)
Calcium: 8.9 mg/dL (ref 8.9–10.3)
Chloride: 104 mmol/L (ref 98–111)
Creatinine, Ser: 0.72 mg/dL (ref 0.61–1.24)
GFR, Estimated: >60 mL/min (ref >60)
Glucose, Bld: 128 mg/dL — ABNORMAL HIGH (ref 70–99)
Potassium: 3.5 mmol/L (ref 3.5–5.1)
Sodium: 137 mmol/L (ref 135–145)

## 2020-12-15 LAB — HEPATIC FUNCTION PANEL
ALT: 57 U/L — ABNORMAL HIGH (ref 0–44)
AST: 32 U/L (ref 15–41)
Albumin: 4 g/dL (ref 3.5–5.0)
Alkaline Phosphatase: 172 U/L — ABNORMAL HIGH (ref 38–126)
Bilirubin, Direct: 0.1 mg/dL (ref 0.0–0.2)
Indirect Bilirubin: 0.3 mg/dL (ref 0.3–0.9)
Total Bilirubin: 0.4 mg/dL (ref 0.3–1.2)
Total Protein: 7.6 g/dL (ref 6.5–8.1)

## 2020-12-15 LAB — D-DIMER, QUANTITATIVE: D-Dimer, Quant: 0.3 ug/mL-FEU (ref 0.00–0.50)

## 2020-12-15 LAB — LIPASE, BLOOD: Lipase: 36 U/L (ref 11–51)

## 2020-12-15 LAB — TROPONIN I (HIGH SENSITIVITY)
Troponin I (High Sensitivity): 2 ng/L (ref <18)
Troponin I (High Sensitivity): 3 ng/L (ref <18)

## 2020-12-15 MED ORDER — ALUM & MAG HYDROXIDE-SIMETH 200-200-20 MG/5ML PO SUSP
30.0000 mL | Freq: Once | ORAL | Status: AC
  Administered 2020-12-15: 30 mL via ORAL
  Filled 2020-12-15: qty 30

## 2020-12-15 MED ORDER — ONDANSETRON HCL 4 MG/2ML IJ SOLN
4.0000 mg | Freq: Once | INTRAMUSCULAR | Status: AC
  Administered 2020-12-15: 4 mg via INTRAVENOUS
  Filled 2020-12-15: qty 2

## 2020-12-15 MED ORDER — OMEPRAZOLE 20 MG PO CPDR: 20.0000 mg | DELAYED_RELEASE_CAPSULE | Freq: Every day | ORAL | 0 refills | Status: DC

## 2020-12-15 MED ORDER — LIDOCAINE VISCOUS HCL 2 % MT SOLN
15.0000 mL | Freq: Once | OROMUCOSAL | Status: AC
  Administered 2020-12-15: 15 mL via ORAL
  Filled 2020-12-15: qty 15

## 2020-12-15 MED ORDER — ONDANSETRON 4 MG PO TBDP: 4.0000 mg | ORAL_TABLET | Freq: Three times a day (TID) | ORAL | 0 refills | Status: DC | PRN

## 2020-12-15 MED ORDER — IOHEXOL 350 MG/ML SOLN
100.0000 mL | Freq: Once | INTRAVENOUS | Status: AC | PRN
  Administered 2020-12-15: 100 mL via INTRAVENOUS

## 2020-12-15 NOTE — Telephone Encounter (Signed)
Patient seen in the ED at [facility] with adenopathy identified on CT scan. Family history of sarcoidosis. Needs evaluation in pulmonary clinic. Can we please make an appointment within the next 2-3 weeks? Thanks!  Steffanie Dunn, DO 12/15/20 9:52 AM [facility]

## 2020-12-15 NOTE — ED Triage Notes (Signed)
Pt states chest pain and pressure abdomen and head pressure "feels like a weight".

## 2020-12-15 NOTE — ED Provider Notes (Signed)
[facility] EMERGENCY DEPARTMENT Provider Note   CSN: [id number] Arrival date & time: 12/15/20  0406     History Chief Complaint  Patient presents with  . Chest Pain  . Shortness of Breath    Sean Burch is a 40 y.o. male.  Patient presents with chest and abdominal pressure that has been constant since approximately 10 AM yesterday.  He describes 'feeling of something sitting on my stomach and chest" it is a constant pressure associate with belching and nausea but no vomiting.  Intermittently he has sharp twinges of pain in his left chest that last for a few seconds at a time.  There is no radiation of the pain to his arms, back, neck.  Does have some shortness of breath per family member at bedside.  No cough or fever.  No vomiting.  Does have a history of reflux but does not feel similar to this.  No cardiac history or pulmonary history.  No history of intra-abdominal pathology.  Reports food makes the pain worse please not had any diarrhea or vomiting.  Also feels some pressure and tightness in his head but denies headache.  Denies focal weakness, numbness or tingling.  No fever.  No pain with urination or blood in the urine  The history is provided by the patient.  Chest Pain Associated symptoms: abdominal pain, fatigue, nausea, shortness of breath and vomiting   Associated symptoms: no dizziness, no fever, no headache and no weakness   Shortness of Breath Associated symptoms: abdominal pain, chest pain and vomiting   Associated symptoms: no fever and no headaches        Past Medical History:  Diagnosis Date  . Stuttering     There are no problems to display for this patient.   Past Surgical History:  Procedure Laterality Date  . KNEE ARTHROSCOPY W/ ACL RECONSTRUCTION    . KNEE SURGERY         History reviewed. No pertinent family history.  Social History   Tobacco Use  . Smoking status: Current Every Day Smoker    Packs/day: 1.00    Types:  Cigarettes  . Smokeless tobacco: Never Used  Substance Use Topics  . Alcohol use: Yes    Comment: socially  . Drug use: No    Home Medications Prior to Admission medications   Medication Sig Start Date End Date Taking? Authorizing Provider  acetaminophen (TYLENOL) 500 MG tablet Take 2 tablets (1,000 mg total) by mouth every 8 (eight) hours as needed. Patient not taking: Reported on 09/26/2020 06/26/20   Darr, Gerilyn Pilgrim, PA-C  Ascorbic Acid (VITAMIN C PO) Take 1 tablet by mouth daily.    [provider]  ibuprofen (ADVIL) 200 MG tablet Take 800 mg by mouth every 6 (six) hours as needed for headache or moderate pain.    [provider]  OVER THE COUNTER MEDICATION Take 1 tablet by mouth daily. Seamoss otc supplement    [provider]  VITAMIN D PO Take 1 tablet by mouth daily.    [provider]  famotidine (PEPCID) 20 MG tablet Take 20 mg by mouth daily as needed for heartburn or indigestion.  07/24/20  [provider]    Allergies    Patient has no known allergies.  Review of Systems   Review of Systems  Constitutional: Positive for activity change, appetite change and fatigue. Negative for fever.  HENT: Negative for congestion and rhinorrhea.   Eyes: Negative for visual disturbance.  Respiratory:  Positive for chest tightness and shortness of breath.   Cardiovascular: Positive for chest pain.  Gastrointestinal: Positive for abdominal pain, nausea and vomiting.  Genitourinary: Negative for dysuria and hematuria.  Musculoskeletal: Negative for arthralgias and myalgias.  Neurological: Negative for dizziness, weakness and headaches.   all other systems are negative except as noted in the HPI and PMH.   Physical Exam Updated Vital Signs BP 125/88 (BP Location: Right Arm)   Pulse 87   Temp 99.4 F (37.4 C) (Oral)   Resp 19   Ht 6\' 5"  (1.956 m)   Wt 117.9 kg   SpO2 98%   BMI 30.83 kg/m   Physical Exam Vitals and nursing note reviewed.   Constitutional:      General: He is not in acute distress.    Appearance: He is well-developed and well-nourished.  HENT:     Head: Normocephalic and atraumatic.     Mouth/Throat:     Mouth: Oropharynx is clear and moist.     Pharynx: No oropharyngeal exudate.  Eyes:     Extraocular Movements: EOM normal.     Conjunctiva/sclera: Conjunctivae normal.     Pupils: Pupils are equal, round, and reactive to light.  Neck:     Comments: No meningismus. Cardiovascular:     Rate and Rhythm: Normal rate and regular rhythm.     Pulses: Intact distal pulses.     Heart sounds: Normal heart sounds. No murmur heard.     Comments: Equal radial pulses and grip strength Pulmonary:     Effort: Pulmonary effort is normal. No respiratory distress.     Breath sounds: Normal breath sounds.  Chest:     Chest wall: No tenderness.  Abdominal:     Palpations: Abdomen is soft.     Tenderness: There is abdominal tenderness. There is no guarding or rebound.     Comments: Epigastric tenderness, no guarding or rebound  Musculoskeletal:        General: No tenderness or edema. Normal range of motion.     Cervical back: Normal range of motion and neck supple.  Skin:    General: Skin is warm.  Neurological:     Mental Status: He is alert and oriented to person, place, and time.     Cranial Nerves: No cranial nerve deficit.     Motor: No abnormal muscle tone.     Coordination: Coordination normal.     Comments: No ataxia on finger to nose bilaterally. No pronator drift. 5/5 strength throughout. CN 2-12 intact.Equal grip strength. Sensation intact.   Psychiatric:        Mood and Affect: Mood and affect normal.        Behavior: Behavior normal.     ED Results / Procedures / Treatments   Labs (all labs ordered are listed, but only abnormal results are displayed) Labs Reviewed  BASIC METABOLIC PANEL - Abnormal; Notable for the following components:      Result Value   Glucose, Bld 128 (*)    All other  components within normal limits  HEPATIC FUNCTION PANEL - Abnormal; Notable for the following components:   ALT 57 (*)    Alkaline Phosphatase 172 (*)    All other components within normal limits  CBC WITH DIFFERENTIAL/PLATELET  D-DIMER, QUANTITATIVE (NOT AT [facility])  LIPASE, BLOOD  TROPONIN I (HIGH SENSITIVITY)  TROPONIN I (HIGH SENSITIVITY)    EKG EKG Interpretation  Date/Time:  Saturday December 15 2020 04:10:12 EST Ventricular Rate:  88 PR Interval:  174 QRS Duration: 114 QT Interval:  382 QTC Calculation: 462 R Axis:   -39 Text Interpretation: Normal sinus rhythm Left axis deviation Nonspecific T wave abnormality Prolonged QT Abnormal ECG Atrial escape complexes Confirmed by Glynn Octave [phone]) on 12/15/2020 4:30:34 AM   Radiology DG Chest Portable 1 View  Result Date: 12/15/2020 CLINICAL DATA:  40 year old male with chest pain and pressure. EXAM: PORTABLE CHEST 1 VIEW COMPARISON:  Chest radiographs 11/16/2013 and earlier. FINDINGS: Portable AP upright view at 0457 hours. Lung volumes and mediastinal contours are stable and within normal limits. Visualized tracheal air column is within normal limits. Allowing for portable technique the lungs are clear. No pneumothorax. No osseous abnormality identified. Negative visible bowel gas. IMPRESSION: Negative portable chest. Electronically Signed   By: Odessa Fleming M.D.   On: 12/15/2020 05:24    Procedures Procedures   Medications Ordered in ED Medications  alum & mag hydroxide-simeth (MAALOX/MYLANTA) 200-200-20 MG/5ML suspension 30 mL (has no administration in time range)    And  lidocaine (XYLOCAINE) 2 % viscous mouth solution 15 mL (has no administration in time range)  ondansetron (ZOFRAN) injection 4 mg (has no administration in time range)    ED Course  I have reviewed the triage vital signs and the nursing notes.  Pertinent labs & imaging results that were available during my care of the patient were reviewed by me and  considered in my medical decision making (see chart for details).    MDM Rules/Calculators/A&P                         Constant chest and abdominal pressure since approximately 10 AM yesterday.  EKG is nonischemic without acute ST changes.  Equal peripheral pulses.  Low suspicion for ACS, aortic dissection, pulmonary embolism  Patient given IV fluids as well as symptom control.  GI cocktail given.  Labs show normal lipase and LFTs.  Negative troponin negative D-dimer.  Chest x-ray is negative.  With ongoing pain in his stomach and chest we will proceed with aortic dissection study.  Patient feels slightly improved after medications. Second troponin will be obtained at shift change.  Anticipate discharge home with symptoms controlled with treatment for likely gastritis/esophagitis.  Avoid alcohol, caffeine, spicy foods, NSAID medications.  We will start PPI. Final Clinical Impression(s) / ED Diagnoses Final diagnoses:  None    Rx / DC Orders ED Discharge Orders    None       Mandeep Ferch, Jeannett Senior, MD 12/15/20 [phone]

## 2020-12-15 NOTE — ED Notes (Signed)
Assumed care of this patient. Pt reports chest tightness since 10 am yesterday. Pt also reports bleaching sensation in chest and stomach. Vitals taken. NAD. Family at bedside. Connected to BP and pulse ox. Stretcher low, wheels locked, call bell within reach. MD at bedside.

## 2020-12-15 NOTE — ED Provider Notes (Signed)
  Physical Exam  BP 112/71   Pulse 66   Temp 99 F (37.2 C) (Oral)   Resp 18   Ht 6\' 5"  (1.956 m)   Wt 117.9 kg   SpO2 100%   BMI 30.83 kg/m   Physical Exam  ED Course/Procedures     Procedures  MDM  Received care of patient from Dr. . Please see his note for priorhistoyr, physical and care. Briefly, this is a 40yo male who presented with chest pain with abdominal discomfort.  Plan for CT dissection study and repeat troponin.  Repeat troponin negative, no signs of ACS.  CT dissection study shows lymphadenopathy present in chest and abdomen with ddx including sarcoidosis (which his sister has) or lymphoproliferative disease.  Discussed with Dr. 40yo of pulmonology who is notifying office for follow up and patient also given information for pulmonology for follow up regarding these findings.        Sean Spore, MD 12/16/20 [phone]

## 2020-12-15 NOTE — Discharge Instructions (Addendum)
There is no evidence of heart attack or blood clot in the lung.  You should take the stomach medication as prescribed.  Avoid alcohol, caffeine, spicy foods, NSAID medications such as naproxen or ibuprofen.  Follow-up with your doctor.  Return to the ED if you develop exertional chest pain, shortness of breath, vomiting or any other concerns.  Your CT does show enlarged lymph nodes in your chest and abdomen. This may be sarcoidosis (like your sister has) or represent something else (lymphoproliferative disorder/lymphoma) and requires close follow up. You will be receiving a call from pulmonology for an appointment and have been given the number to call if you do not hear from them on Monday or Tuesday.

## 2020-12-17 NOTE — Telephone Encounter (Signed)
atc patient mailbox full unable to leave message Patient needs follow up with [facility] 01/01/21 for eval family history of sarcoid , if unable to get in with JE , can be scheduled with RA , RB, Mannam, or Celine Mans

## 2021-01-01 ENCOUNTER — Encounter: Payer: Self-pay | Admitting: Pulmonary Disease

## 2021-01-01 ENCOUNTER — Other Ambulatory Visit: Payer: Self-pay

## 2021-01-01 ENCOUNTER — Ambulatory Visit (INDEPENDENT_AMBULATORY_CARE_PROVIDER_SITE_OTHER): Payer: 59 | Admitting: Pulmonary Disease

## 2021-01-01 VITALS — BP 124/84 | HR 82 | Temp 98.5°F | Ht 77.0 in | Wt 261.5 lb

## 2021-01-01 DIAGNOSIS — R590 Localized enlarged lymph nodes: Secondary | ICD-10-CM | POA: Diagnosis not present

## 2021-01-01 DIAGNOSIS — J029 Acute pharyngitis, unspecified: Secondary | ICD-10-CM

## 2021-01-01 MED ORDER — PREDNISONE 20 MG PO TABS: 20.0000 mg | ORAL_TABLET | Freq: Every day | ORAL | 0 refills | Status: DC

## 2021-01-01 NOTE — Progress Notes (Signed)
  Subjective:   PATIENT ID: Sean Burch GENDER: male DOB: 04/03/1981, MRN: [id number]   HPI  Chief Complaint  Patient presents with  . Hospitalization Follow-up    Chest is feeling better from the hospital stay.  Lymph nodes were swollen in the chest and in his neck and right side of his neck has been a little sore.  Nasal drainage x 4 days and causing sore throat    Reason for Visit: New consult for mediastinal adenopathy  Sean Burch is a 40 year old male current smoker with no pertinent medical history who presents for abnormal CTA concerning mediastinal and abdominal adenopathy.  He recently presented to the ED on 12/15/20 for gradually worsening bilaterally abdominal pain over six hour period that began lower chest/upper abdominal pain associated with belching that progressed into a panic attack. After discharge his chest/abdominal pain dissipated after two days.After that he denies any recurrent chest pain/abdominal pain. He reports a two week history of intermittent loose stools, gas, bloating, belching. Denies hematochezia or melena.   Five days ago he reports rhinorrhea, productive cough, sensation of food being stuck. Denies fevers, chills.  Denies unintentional weight loss, night sweats. Reports fatigue but may be related to working 3rd shift for three years. Denies visual changes, shortness of breath, cough, wheezing, palpitations, dizziness, headaches, skin changes.  He has a sister with sarcoidosis with eye involvement.  Social History: Forklift driver night shift  I have personally reviewed patient's past medical/family/social history, allergies, current medications.  Past Medical History:  Diagnosis Date  . Stuttering      Family History  Problem Relation Age of Onset  . Sarcoidosis Sister      Social History   Occupational History  . Not on file  Tobacco Use  . Smoking status: Current Every Day Smoker    Packs/day: 1.00    Types: Cigarettes  .  Smokeless tobacco: Never Used  Substance and Sexual Activity  . Alcohol use: Yes    Comment: socially  . Drug use: No  . Sexual activity: Not on file    No Known Allergies   Outpatient Medications Prior to Visit  Medication Sig Dispense Refill  . acetaminophen (TYLENOL) 500 MG tablet Take 2 tablets (1,000 mg total) by mouth every 8 (eight) hours as needed. (Patient not taking: Reported on 09/26/2020) 30 tablet 0  . Ascorbic Acid (VITAMIN C PO) Take 1 tablet by mouth daily.    . ibuprofen (ADVIL) 200 MG tablet Take 800 mg by mouth every 6 (six) hours as needed for headache or moderate pain.    . omeprazole (PRILOSEC) 20 MG capsule Take 1 capsule (20 mg total) by mouth daily. 30 capsule 0  . ondansetron (ZOFRAN ODT) 4 MG disintegrating tablet Take 1 tablet (4 mg total) by mouth every 8 (eight) hours as needed for nausea or vomiting. 20 tablet 0  . OVER THE COUNTER MEDICATION Take 1 tablet by mouth daily. Seamoss otc supplement    . VITAMIN D PO Take 1 tablet by mouth daily.     No facility-administered medications prior to visit.    Review of Systems  Constitutional: Negative for chills, diaphoresis, fever, malaise/fatigue and weight loss.  HENT: Positive for congestion. Negative for ear pain and sore throat.   Respiratory: Negative for cough, hemoptysis, sputum production, shortness of breath and wheezing.   Cardiovascular: Positive for chest pain. Negative for palpitations and leg swelling.  Gastrointestinal: Positive for abdominal pain and diarrhea. Negative for heartburn   and nausea.       Bloating  Genitourinary: Negative for frequency.  Musculoskeletal: Negative for joint pain and myalgias.  Skin: Negative for itching and rash.  Neurological: Negative for dizziness, weakness and headaches.  Endo/Heme/Allergies: Does not bruise/bleed easily.  Psychiatric/Behavioral: Negative for depression. The patient is nervous/anxious.      Objective:   Vitals:   01/01/21 0854  BP:  124/84  Pulse: 82  Temp: 98.5 F (36.9 C)  TempSrc: Tympanic  SpO2: 97%  Weight: 261 lb 8 oz (118.6 kg)  Height: 6\' 5"  (1.956 m)      Physical Exam: General: Well-appearing, no acute distress HENT: [place], AT, OP clear, MMM, poor dentition Eyes: EOMI, no scleral icterus Respiratory: Clear to auscultation bilaterally.  No crackles, wheezing or rales Cardiovascular: RRR, -M/R/G, no JVD GI: BS+, soft, nontender Extremities:-Edema,-tenderness Neuro: AAO x4, CNII-XII grossly intact Skin: Intact, no rashes or bruising Psych: Normal mood, normal affect  Data Reviewed:  Imaging: CTA 12/15/20 - Centrilobular emphysema with upper lobe involvement. Borderline enlargement of mediastinal lymph nodes. No hilar adenopathy. No infiltrate, effusion or edema. Small upper abdominal lymph nodes including on the gastrohepatic ligament noted.  PFT: None  Labs: CBC    Component Value Date/Time   WBC 6.2 12/15/2020 0539   RBC 5.45 12/15/2020 0539   HGB 14.5 12/15/2020 0539   HCT 43.9 12/15/2020 0539   PLT 224 12/15/2020 0539   MCV 80.6 12/15/2020 0539   MCH 26.6 12/15/2020 0539   MCHC 33.0 12/15/2020 0539   RDW 15.1 12/15/2020 0539   LYMPHSABS 1.7 12/15/2020 0539   MONOABS 0.7 12/15/2020 0539   EOSABS 0.1 12/15/2020 0539   BASOSABS 0.0 12/15/2020 0539   CMP Latest Ref Rng & Units 12/15/2020 07/23/2020 11/06/2013  Glucose 70 - 99 mg/dL 01/04/2014) 597(C) 163(A)  BUN 6 - 20 mg/dL 10 5(L) 6  Creatinine 453(M - 1.24 mg/dL 4.68 0.32 1.22  Sodium 135 - 145 mmol/L 137 139 139  Potassium 3.5 - 5.1 mmol/L 3.5 3.7 3.4(L)  Chloride 98 - 111 mmol/L 104 102 101  CO2 22 - 32 mmol/L 24 26 26   Calcium 8.9 - 10.3 mg/dL 8.9 9.0 9.0  Total Protein 6.5 - 8.1 g/dL 7.6 7.5 7.6  Total Bilirubin 0.3 - 1.2 mg/dL 0.4 0.9 0.4  Alkaline Phos 38 - 126 U/L 172(H) 222(H) 130(H)  AST 15 - 41 U/L 32 41 20  ALT 0 - 44 U/L 57(H) 63(H) 26   Pathology Liver biopsy 10/03/20 The biopsy shows liver with a well-preserved  architecture. Overall,  portal tracts alternate fairly regularly with central veins and normally  radiating hepatocellular trabeculae. Portal tracts show mild, mostly  mononuclear inflammatory cell infiltrate with patchy mild interface  hepatitis. Ceroid-laden macrophages are present. A few bile ducts  appear to be missing from larger portal tracts but there is no  ductopenia. Portal vascular structures are histologically unremarkable.  Hepatic lobules show mild, mostly large droplet macrovesicular steatosis  without evidence of steatohepatitis. Scattered foci of lobular  necroinflammation with rare apoptotic bodies are identified. There is  no cholestasis. Some of the larger portal tracts appear expanded on  trichrome and reticulin stains. Iron stain shows mild granular iron  deposition within Kupffer cells, consistent with mild secondary iron  overload. PASD stain is negative for globular intracytoplasmic  inclusions. The histologic findings are nonspecific but drug-induced  liver injury needs to be considered.      Assessment & Plan:   Discussion: 39 year old male current  smoker with borderline-enlarged mediastinal and abdominal lymphadenopathy. Family history positive for sarcoid (sister - eye involvement). Recent evaluation for abdominal pain and persistently elevated AP. Abdominal US neg. Liver biopsy with non-specific findings and no comment on presence of granulomas. Unclear what the cause of his lymphadenopathy is related.  Prior to his same day visit, I contacted Radiology department and reviewed imaging including potential sites for biopsy.  Based on imaging, We discussed diagnostic testing with bronchoscopy vs conservative management with further imaging. We discussed risks and benefits of procedure including infection, bleeding and lung collapse. After discussion, he wishes to discuss procedure with family before consenting.  Mediastinal lymphadenopathy --Await patient  to contact office to schedule bronchoscopy --If patient agrees, we will schedule EBUS with general anesthesia (no fluoroscopy, no meds to be held)  Acute pharyngitis --START prednisone 20 mg x 5 days  Tobacco abuse Patient is an active smoker. Encouraged to quit smoking  Health Maintenance Immunization History  Administered Date(s) Administered  . Tdap 06/23/2013   CT Lung Screen - not indicated based on age  No orders of the defined types were placed in this encounter.  Meds ordered this encounter  Medications  . predniSONE (DELTASONE) 20 MG tablet    Sig: Take 1 tablet (20 mg total) by mouth daily with breakfast.    Dispense:  5 tablet    Refill:  0    Return in about 3 months (around 04/03/2021).  I have spent a total time of 50-minutes on the day of the appointment reviewing prior documentation, coordinating care and discussing medical diagnosis and plan with the patient/family. Imaging, labs and tests included in this note have been reviewed and interpreted independently by me.  Travarus Trudo Mechele Collin, MD [facility] 01/01/2021 8:26 AM  Office Number [phone]

## 2021-01-01 NOTE — H&P (View-Only) (Signed)
Subjective:   PATIENT ID: Sean Burch GENDER: male DOB: July 10, 1981, MRN: [id number]   HPI  Chief Complaint  Patient presents with  . Hospitalization Follow-up    Chest is feeling better from the hospital stay.  Lymph nodes were swollen in the chest and in his neck and right side of his neck has been a little sore.  Nasal drainage x 4 days and causing sore throat    Reason for Visit: New consult for mediastinal adenopathy  [name] is a 40 year old male current smoker with no pertinent medical history who presents for abnormal CTA concerning mediastinal and abdominal adenopathy.  He recently presented to the ED on 12/15/20 for gradually worsening bilaterally abdominal pain over six hour period that began lower chest/upper abdominal pain associated with belching that progressed into a panic attack. After discharge his chest/abdominal pain dissipated after two days.After that he denies any recurrent chest pain/abdominal pain. He reports a two week history of intermittent loose stools, gas, bloating, belching. Denies hematochezia or melena.   Five days ago he reports rhinorrhea, productive cough, sensation of food being stuck. Denies fevers, chills.  Denies unintentional weight loss, night sweats. Reports fatigue but may be related to working 3rd shift for three years. Denies visual changes, shortness of breath, cough, wheezing, palpitations, dizziness, headaches, skin changes.  He has a sister with sarcoidosis with eye involvement.  Social History: Forklift driver night shift  I have personally reviewed patient's past medical/family/social history, allergies, current medications.  Past Medical History:  Diagnosis Date  . Stuttering      Family History  Problem Relation Age of Onset  . Sarcoidosis Sister      Social History   Occupational History  . Not on file  Tobacco Use  . Smoking status: Current Every Day Smoker    Packs/day: 1.00    Types: Cigarettes  .  Smokeless tobacco: Never Used  Substance and Sexual Activity  . Alcohol use: Yes    Comment: socially  . Drug use: No  . Sexual activity: Not on file    No Known Allergies   Outpatient Medications Prior to Visit  Medication Sig Dispense Refill  . acetaminophen (TYLENOL) 500 MG tablet Take 2 tablets (1,000 mg total) by mouth every 8 (eight) hours as needed. (Patient not taking: Reported on 09/26/2020) 30 tablet 0  . Ascorbic Acid (VITAMIN C PO) Take 1 tablet by mouth daily.    Marland Kitchen ibuprofen (ADVIL) 200 MG tablet Take 800 mg by mouth every 6 (six) hours as needed for headache or moderate pain.    Marland Kitchen omeprazole (PRILOSEC) 20 MG capsule Take 1 capsule (20 mg total) by mouth daily. 30 capsule 0  . ondansetron (ZOFRAN ODT) 4 MG disintegrating tablet Take 1 tablet (4 mg total) by mouth every 8 (eight) hours as needed for nausea or vomiting. 20 tablet 0  . OVER THE COUNTER MEDICATION Take 1 tablet by mouth daily. Seamoss otc supplement    . VITAMIN D PO Take 1 tablet by mouth daily.     No facility-administered medications prior to visit.    Review of Systems  Constitutional: Negative for chills, diaphoresis, fever, malaise/fatigue and weight loss.  HENT: Positive for congestion. Negative for ear pain and sore throat.   Respiratory: Negative for cough, hemoptysis, sputum production, shortness of breath and wheezing.   Cardiovascular: Positive for chest pain. Negative for palpitations and leg swelling.  Gastrointestinal: Positive for abdominal pain and diarrhea. Negative for heartburn  and nausea.       Bloating  Genitourinary: Negative for frequency.  Musculoskeletal: Negative for joint pain and myalgias.  Skin: Negative for itching and rash.  Neurological: Negative for dizziness, weakness and headaches.  Endo/Heme/Allergies: Does not bruise/bleed easily.  Psychiatric/Behavioral: Negative for depression. The patient is nervous/anxious.      Objective:   Vitals:   01/01/21 0854  BP:  124/84  Pulse: 82  Temp: 98.5 F (36.9 C)  TempSrc: Tympanic  SpO2: 97%  Weight: 261 lb 8 oz (118.6 kg)  Height: 6\' 5"  (1.956 m)      Physical Exam: General: Well-appearing, no acute distress HENT: [place], AT, OP clear, MMM, poor dentition Eyes: EOMI, no scleral icterus Respiratory: Clear to auscultation bilaterally.  No crackles, wheezing or rales Cardiovascular: RRR, -M/R/G, no JVD GI: BS+, soft, nontender Extremities:-Edema,-tenderness Neuro: AAO x4, CNII-XII grossly intact Skin: Intact, no rashes or bruising Psych: Normal mood, normal affect  Data Reviewed:  Imaging: CTA 12/15/20 - Centrilobular emphysema with upper lobe involvement. Borderline enlargement of mediastinal lymph nodes. No hilar adenopathy. No infiltrate, effusion or edema. Small upper abdominal lymph nodes including on the gastrohepatic ligament noted.  PFT: None  Labs: CBC    Component Value Date/Time   WBC 6.2 12/15/2020 0539   RBC 5.45 12/15/2020 0539   HGB 14.5 12/15/2020 0539   HCT 43.9 12/15/2020 0539   PLT 224 12/15/2020 0539   MCV 80.6 12/15/2020 0539   MCH 26.6 12/15/2020 0539   MCHC 33.0 12/15/2020 0539   RDW 15.1 12/15/2020 0539   LYMPHSABS 1.7 12/15/2020 0539   MONOABS 0.7 12/15/2020 0539   EOSABS 0.1 12/15/2020 0539   BASOSABS 0.0 12/15/2020 0539   CMP Latest Ref Rng & Units 12/15/2020 07/23/2020 11/06/2013  Glucose 70 - 99 mg/dL 01/04/2014) 597(C) 163(A)  BUN 6 - 20 mg/dL 10 5(L) 6  Creatinine 453(M - 1.24 mg/dL 4.68 0.32 1.22  Sodium 135 - 145 mmol/L 137 139 139  Potassium 3.5 - 5.1 mmol/L 3.5 3.7 3.4(L)  Chloride 98 - 111 mmol/L 104 102 101  CO2 22 - 32 mmol/L 24 26 26   Calcium 8.9 - 10.3 mg/dL 8.9 9.0 9.0  Total Protein 6.5 - 8.1 g/dL 7.6 7.5 7.6  Total Bilirubin 0.3 - 1.2 mg/dL 0.4 0.9 0.4  Alkaline Phos 38 - 126 U/L 172(H) 222(H) 130(H)  AST 15 - 41 U/L 32 41 20  ALT 0 - 44 U/L 57(H) 63(H) 26   Pathology Liver biopsy 10/03/20 The biopsy shows liver with a well-preserved  architecture. Overall,  portal tracts alternate fairly regularly with central veins and normally  radiating hepatocellular trabeculae. Portal tracts show mild, mostly  mononuclear inflammatory cell infiltrate with patchy mild interface  hepatitis. Ceroid-laden macrophages are present. A few bile ducts  appear to be missing from larger portal tracts but there is no  ductopenia. Portal vascular structures are histologically unremarkable.  Hepatic lobules show mild, mostly large droplet macrovesicular steatosis  without evidence of steatohepatitis. Scattered foci of lobular  necroinflammation with rare apoptotic bodies are identified. There is  no cholestasis. Some of the larger portal tracts appear expanded on  trichrome and reticulin stains. Iron stain shows mild granular iron  deposition within Kupffer cells, consistent with mild secondary iron  overload. PASD stain is negative for globular intracytoplasmic  inclusions. The histologic findings are nonspecific but drug-induced  liver injury needs to be considered.      Assessment & Plan:   Discussion: 39 year old male current  smoker with borderline-enlarged mediastinal and abdominal lymphadenopathy. Family history positive for sarcoid (sister - eye involvement). Recent evaluation for abdominal pain and persistently elevated AP. Abdominal US neg. Liver biopsy with non-specific findings and no comment on presence of granulomas. Unclear what the cause of his lymphadenopathy is related.  Prior to his same day visit, I contacted Radiology department and reviewed imaging including potential sites for biopsy.  Based on imaging, We discussed diagnostic testing with bronchoscopy vs conservative management with further imaging. We discussed risks and benefits of procedure including infection, bleeding and lung collapse. After discussion, he wishes to discuss procedure with family before consenting.  Mediastinal lymphadenopathy --Await patient  to contact office to schedule bronchoscopy --If patient agrees, we will schedule EBUS with general anesthesia (no fluoroscopy, no meds to be held)  Acute pharyngitis --START prednisone 20 mg x 5 days  Tobacco abuse Patient is an active smoker. Encouraged to quit smoking  Health Maintenance Immunization History  Administered Date(s) Administered  . Tdap 06/23/2013   CT Lung Screen - not indicated based on age  No orders of the defined types were placed in this encounter.  Meds ordered this encounter  Medications  . predniSONE (DELTASONE) 20 MG tablet    Sig: Take 1 tablet (20 mg total) by mouth daily with breakfast.    Dispense:  5 tablet    Refill:  0    Return in about 3 months (around 04/03/2021).  I have spent a total time of 50-minutes on the day of the appointment reviewing prior documentation, coordinating care and discussing medical diagnosis and plan with the patient/family. Imaging, labs and tests included in this note have been reviewed and interpreted independently by me.  Layal Javid Mechele Collin, MD [facility] 01/01/2021 8:26 AM  Office Number [phone]

## 2021-01-01 NOTE — Patient Instructions (Addendum)
Mediastinal lymphadenopathy --Await patient to contact office to schedule bronchoscopy  Acute viral pharyngitis --START prednisone 20 mg x 5 days  Follow-up with me in 3 months   Pharyngitis  Pharyngitis is redness, pain, and swelling (inflammation) of the throat (pharynx). It is a very common cause of sore throat. Pharyngitis can be caused by a bacteria, but it is usually caused by a virus. Most cases of pharyngitis get better on their own without treatment. What are the causes? This condition may be caused by:  Infection by viruses (viral). Viral pharyngitis spreads from person to person (is contagious) through coughing, sneezing, and sharing of personal items or utensils such as cups, forks, spoons, and toothbrushes.  Infection by bacteria (bacterial). Bacterial pharyngitis may be spread by touching the nose or face after coming in contact with the bacteria, or through more intimate contact, such as kissing.  Allergies. Allergies can cause buildup of mucus in the throat (post-nasal drip), leading to inflammation and irritation. Allergies can also cause blocked nasal passages, forcing breathing through the mouth, which dries and irritates the throat. What increases the risk? You are more likely to develop this condition if:  You are 75-61 years old.  You are exposed to crowded environments such as daycare, school, or dormitory living.  You live in a cold climate.  You have a weakened disease-fighting (immune) system. What are the signs or symptoms? Symptoms of this condition vary by the cause (viral, bacterial, or allergies) and can include:  Sore throat.  Fatigue.  Low-grade fever.  Headache.  Joint pain and muscle aches.  Skin rashes.  Swollen glands in the throat (lymph nodes).  Plaque-like film on the throat or tonsils. This is often a symptom of bacterial pharyngitis.  Vomiting.  Stuffy nose (nasal congestion).  Cough.  Red, itchy eyes  (conjunctivitis).  Loss of appetite. How is this diagnosed? This condition is often diagnosed based on your medical history and a physical exam. Your health care provider will ask you questions about your illness and your symptoms. A swab of your throat may be done to check for bacteria (rapid strep test). Other lab tests may also be done, depending on the suspected cause, but these are rare. How is this treated? This condition usually gets better in 3-4 days without medicine. Bacterial pharyngitis may be treated with antibiotic medicines. Follow these instructions at home:  Take over-the-counter and prescription medicines only as told by your health care provider. ? If you were prescribed an antibiotic medicine, take it as told by your health care provider. Do not stop taking the antibiotic even if you start to feel better. ? Do not give children aspirin because of the association with Reye syndrome.  Drink enough water and fluids to keep your urine clear or pale yellow.  Get a lot of rest.  Gargle with a salt-water mixture 3-4 times a day or as needed. To make a salt-water mixture, completely dissolve -1 tsp of salt in 1 cup of warm water.  If your health care provider approves, you may use throat lozenges or sprays to soothe your throat. Contact a health care provider if:  You have large, tender lumps in your neck.  You have a rash.  You cough up green, yellow-brown, or bloody spit. Get help right away if:  Your neck becomes stiff.  You drool or are unable to swallow liquids.  You cannot drink or take medicines without vomiting.  You have severe pain that does not go away, even  after you take medicine.  You have trouble breathing, and it is not caused by a stuffy nose.  You have new pain and swelling in your joints such as the knees, ankles, wrists, or elbows. Summary  Pharyngitis is redness, pain, and swelling (inflammation) of the throat (pharynx).  While pharyngitis  can be caused by a bacteria, the most common causes are viral.  Most cases of pharyngitis get better on their own without treatment.  Bacterial pharyngitis is treated with antibiotic medicines. This information is not intended to replace advice given to you by your health care provider. Make sure you discuss any questions you have with your health care provider. Document Revised: 10/02/2017 Document Reviewed: 11/25/2016 Elsevier Patient Education  2021 [employer].

## 2021-01-03 ENCOUNTER — Telehealth: Payer: Self-pay | Admitting: Pulmonary Disease

## 2021-01-03 ENCOUNTER — Encounter: Payer: Self-pay | Admitting: Pulmonary Disease

## 2021-01-03 DIAGNOSIS — F410 Panic disorder [episodic paroxysmal anxiety] without agoraphobia: Secondary | ICD-10-CM | POA: Insufficient documentation

## 2021-01-03 DIAGNOSIS — F419 Anxiety disorder, unspecified: Secondary | ICD-10-CM | POA: Insufficient documentation

## 2021-01-03 DIAGNOSIS — R591 Generalized enlarged lymph nodes: Secondary | ICD-10-CM | POA: Insufficient documentation

## 2021-01-03 DIAGNOSIS — R590 Localized enlarged lymph nodes: Secondary | ICD-10-CM

## 2021-01-03 NOTE — Telephone Encounter (Signed)
I have scheduled pt for 3/14 at 8:00 at [facility].  Covid test on 3/11 at 9:20.  I have left vm for pt to call me back for appt info.

## 2021-01-03 NOTE — Telephone Encounter (Addendum)
Please schedule patient for endobronchial ultrasound with biopsy via needle aspiration/forcep biopsy/brusings/washings. Will need general anesthesia. No fluoroscopy. Please make comment "prefer LMA intubation to access 2R lymph node"  I am available at any time from 3/14 to 3/17. No time preference between 8-4pm.    Order has been placed as well.  Mechele Collin, M.D. [facility] 01/03/2021 11:12 AM

## 2021-01-03 NOTE — Telephone Encounter (Signed)
Call returned to patient, confirmed DOB. Patient states MD told him to call back once he was ready to schedule his bronch. Per AVS "Await patient to contact office to schedule bronch."   Will route message to get scheduled.   Please schedule Bronch and Covid test. Thanks :)

## 2021-01-03 NOTE — Telephone Encounter (Signed)
I will route to Dr. Everardo All to confirm what dates she is available and for the order instructions.

## 2021-01-04 NOTE — Telephone Encounter (Signed)
I have called pt & left him another vm to call me back for appt info.

## 2021-01-07 NOTE — Telephone Encounter (Signed)
I spoke to pt's sister Sherrie who is his contact.  She works for [employer] and has his Hydrologist.  She is going to make sure he has appt info.

## 2021-01-11 ENCOUNTER — Encounter (HOSPITAL_COMMUNITY): Payer: Self-pay | Admitting: Pulmonary Disease

## 2021-01-11 ENCOUNTER — Other Ambulatory Visit (HOSPITAL_COMMUNITY)
Admission: RE | Admit: 2021-01-11 | Discharge: 2021-01-11 | Disposition: A | Discharge status: Home / Self Care | Payer: 59 | Source: Ambulatory Visit | Attending: Pulmonary Disease | Admitting: Pulmonary Disease

## 2021-01-11 ENCOUNTER — Telehealth: Payer: Self-pay | Admitting: Pulmonary Disease

## 2021-01-11 DIAGNOSIS — Z01812 Encounter for preprocedural laboratory examination: Secondary | ICD-10-CM | POA: Diagnosis present

## 2021-01-11 DIAGNOSIS — Z20822 Contact with and (suspected) exposure to covid-19: Secondary | ICD-10-CM | POA: Insufficient documentation

## 2021-01-11 LAB — SARS CORONAVIRUS 2 (TAT 6-24 HRS): SARS Coronavirus 2: NEGATIVE

## 2021-01-11 NOTE — Progress Notes (Signed)
Spoke with pt for pre-op call. Pt states he has hx of an irregular heart rate. States he has not seen a cardiologist and it doesn't bother him. Pt states he his not diabetic. Pt does stutter.   Covid test done today, result pending. Pt states he's been in quarantine since the test was done and understands that he stays in quarantine until he comes to the hospital on Monday.

## 2021-01-11 NOTE — Telephone Encounter (Signed)
Orders placed.

## 2021-01-11 NOTE — Telephone Encounter (Signed)
I received staff message from Dala Dock RN in [place] [facility] asking for Dr Everardo All to put in pre-op orders for pt.  Procedure is scheduled for Monday.

## 2021-01-13 NOTE — Anesthesia Preprocedure Evaluation (Signed)
Anesthesia Evaluation  Patient identified by MRN, date of birth, ID band Patient awake    Reviewed: Allergy & Precautions, NPO status , Patient's Chart, lab work & pertinent test results  Airway Mallampati: II  TM Distance: >3 FB Neck ROM: Full    Dental  (+) Chipped,    Pulmonary Current Smoker,  Mediastinal adenopathy   Pulmonary exam normal        Cardiovascular negative cardio ROS   Rhythm:Regular Rate:Normal     Neuro/Psych Anxiety negative neurological ROS     GI/Hepatic Neg liver ROS, GERD  Medicated,  Endo/Other  negative endocrine ROS  Renal/GU negative Renal ROS  negative genitourinary   Musculoskeletal negative musculoskeletal ROS (+)   Abdominal (+)  Abdomen: soft. Bowel sounds: normal.  Peds  Hematology negative hematology ROS (+)   Anesthesia Other Findings   Reproductive/Obstetrics                            Anesthesia Physical Anesthesia Plan  ASA: II  Anesthesia Plan: General   Post-op Pain Management:    Induction: Intravenous  PONV Risk Score and Plan: Ondansetron, Dexamethasone, Propofol infusion, Midazolam and Treatment may vary due to age or medical condition  Airway Management Planned: Mask and Oral ETT  Additional Equipment: None  Intra-op Plan:   Post-operative Plan: Extubation in OR  Informed Consent: I have reviewed the patients History and Physical, chart, labs and discussed the procedure including the risks, benefits and alternatives for the proposed anesthesia with the patient or authorized representative who has indicated his/her understanding and acceptance.     Dental advisory given  Plan Discussed with: CRNA  Anesthesia Plan Comments: (Lab Results      Component                Value               Date                      WBC                      6.2                 12/15/2020                HGB                      14.5                 12/15/2020                HCT                      43.9                12/15/2020                MCV                      80.6                12/15/2020                PLT                      224  12/15/2020           Lab Results      Component                Value               Date                      NA                       137                 12/15/2020                K                        3.5                 12/15/2020                CO2                      24                  12/15/2020                GLUCOSE                  128 (H)             12/15/2020                BUN                      10                  12/15/2020                CREATININE               0.72                12/15/2020                CALCIUM                  8.9                 12/15/2020                GFRNONAA                 >60                 12/15/2020                GFRAA                    >60                 07/23/2020          )       Anesthesia Quick Evaluation

## 2021-01-14 ENCOUNTER — Encounter (HOSPITAL_COMMUNITY): Payer: Self-pay | Admitting: Pulmonary Disease

## 2021-01-14 ENCOUNTER — Ambulatory Visit (HOSPITAL_COMMUNITY)
Admission: RE | Admit: 2021-01-14 | Discharge: 2021-01-14 | Disposition: A | Discharge status: Home / Self Care | Payer: 59 | Attending: Pulmonary Disease | Admitting: Pulmonary Disease

## 2021-01-14 ENCOUNTER — Encounter (HOSPITAL_COMMUNITY)
Admission: RE | Disposition: A | Discharge status: Home / Self Care | Payer: Self-pay | Source: Home / Self Care | Attending: Pulmonary Disease

## 2021-01-14 ENCOUNTER — Ambulatory Visit (HOSPITAL_COMMUNITY): Payer: 59 | Admitting: Anesthesiology

## 2021-01-14 ENCOUNTER — Other Ambulatory Visit: Payer: Self-pay

## 2021-01-14 ENCOUNTER — Ambulatory Visit (HOSPITAL_COMMUNITY): Payer: 59

## 2021-01-14 DIAGNOSIS — Z01818 Encounter for other preprocedural examination: Secondary | ICD-10-CM

## 2021-01-14 DIAGNOSIS — Z538 Procedure and treatment not carried out for other reasons: Secondary | ICD-10-CM | POA: Diagnosis not present

## 2021-01-14 DIAGNOSIS — Z79899 Other long term (current) drug therapy: Secondary | ICD-10-CM | POA: Diagnosis not present

## 2021-01-14 DIAGNOSIS — R590 Localized enlarged lymph nodes: Secondary | ICD-10-CM | POA: Diagnosis not present

## 2021-01-14 DIAGNOSIS — F17210 Nicotine dependence, cigarettes, uncomplicated: Secondary | ICD-10-CM | POA: Diagnosis not present

## 2021-01-14 HISTORY — PX: BRONCHIAL NEEDLE ASPIRATION BIOPSY: SHX5106

## 2021-01-14 HISTORY — PX: BRONCHIAL WASHINGS: SHX5105

## 2021-01-14 HISTORY — DX: Gastro-esophageal reflux disease without esophagitis: K21.9

## 2021-01-14 HISTORY — DX: Pneumonia, unspecified organism: J18.9

## 2021-01-14 HISTORY — PX: VIDEO BRONCHOSCOPY WITH ENDOBRONCHIAL ULTRASOUND: SHX6177

## 2021-01-14 HISTORY — DX: Cardiac arrhythmia, unspecified: I49.9

## 2021-01-14 HISTORY — DX: Anxiety disorder, unspecified: F41.9

## 2021-01-14 LAB — BODY FLUID CELL COUNT WITH DIFFERENTIAL
Eos, Fluid: 3 %
Lymphs, Fluid: 17 %
Monocyte-Macrophage-Serous Fluid: 63 % (ref 50–90)
Neutrophil Count, Fluid: 17 % (ref 0–25)
Total Nucleated Cell Count, Fluid: 34 cu mm (ref 0–1000)

## 2021-01-14 LAB — CBC
HCT: 44.6 % (ref 39.0–52.0)
Hemoglobin: 14.8 g/dL (ref 13.0–17.0)
MCH: 27.2 pg (ref 26.0–34.0)
MCHC: 33.2 g/dL (ref 30.0–36.0)
MCV: 81.8 fL (ref 80.0–100.0)
Platelets: 242 10*3/uL (ref 150–400)
RBC: 5.45 MIL/uL (ref 4.22–5.81)
RDW: 14.9 % (ref 11.5–15.5)
WBC: 5.6 10*3/uL (ref 4.0–10.5)
nRBC: 0 % (ref 0.0–0.2)

## 2021-01-14 LAB — COMPREHENSIVE METABOLIC PANEL
ALT: 113 U/L — ABNORMAL HIGH (ref 0–44)
AST: 50 U/L — ABNORMAL HIGH (ref 15–41)
Albumin: 3.7 g/dL (ref 3.5–5.0)
Alkaline Phosphatase: 194 U/L — ABNORMAL HIGH (ref 38–126)
Anion gap: 8 (ref 5–15)
BUN: 7 mg/dL (ref 6–20)
CO2: 25 mmol/L (ref 22–32)
Calcium: 9.2 mg/dL (ref 8.9–10.3)
Chloride: 104 mmol/L (ref 98–111)
Creatinine, Ser: 0.82 mg/dL (ref 0.61–1.24)
GFR, Estimated: >60 mL/min (ref >60)
Glucose, Bld: 111 mg/dL — ABNORMAL HIGH (ref 70–99)
Potassium: 4.1 mmol/L (ref 3.5–5.1)
Sodium: 137 mmol/L (ref 135–145)
Total Bilirubin: 0.7 mg/dL (ref 0.3–1.2)
Total Protein: 7.2 g/dL (ref 6.5–8.1)

## 2021-01-14 LAB — PROTIME-INR
INR: 1.2 (ref 0.8–1.2)
Prothrombin Time: 14.3 seconds (ref 11.4–15.2)

## 2021-01-14 LAB — APTT: aPTT: 29 seconds (ref 24–36)

## 2021-01-14 SURGERY — BRONCHOSCOPY, WITH EBUS
Anesthesia: General

## 2021-01-14 MED ORDER — LACTATED RINGERS IV SOLN: INTRAVENOUS | Status: DC

## 2021-01-14 MED ORDER — LIDOCAINE HCL (PF) 1 % IJ SOLN
INTRAMUSCULAR | Status: DC | PRN
  Administered 2021-01-14: 8 mL

## 2021-01-14 MED ORDER — FENTANYL CITRATE (PF) 100 MCG/2ML IJ SOLN
INTRAMUSCULAR | Status: DC | PRN
  Administered 2021-01-14: 100 ug via INTRAVENOUS
  Administered 2021-01-14 (×3): 25 ug via INTRAVENOUS

## 2021-01-14 MED ORDER — LIDOCAINE HCL (PF) 1 % IJ SOLN
INTRAMUSCULAR | Status: AC
  Filled 2021-01-14: qty 30

## 2021-01-14 MED ORDER — DEXMEDETOMIDINE HCL IN NACL 400 MCG/100ML IV SOLN
0.4000 ug/kg/h | INTRAVENOUS | Status: DC
  Administered 2021-01-14: 1 ug/kg/h via INTRAVENOUS
  Filled 2021-01-14 (×2): qty 100

## 2021-01-14 MED ORDER — PHENYLEPHRINE HCL-NACL 10-0.9 MG/250ML-% IV SOLN
INTRAVENOUS | Status: DC | PRN
  Administered 2021-01-14: 50 ug/min via INTRAVENOUS

## 2021-01-14 MED ORDER — PROPOFOL 10 MG/ML IV BOLUS
INTRAVENOUS | Status: DC | PRN
  Administered 2021-01-14: 200 mg via INTRAVENOUS
  Administered 2021-01-14: 50 mg via INTRAVENOUS

## 2021-01-14 MED ORDER — DEXMEDETOMIDINE HCL 200 MCG/2ML IV SOLN
INTRAVENOUS | Status: DC | PRN
  Administered 2021-01-14: 117.9 ug via INTRAVENOUS

## 2021-01-14 MED ORDER — GLYCOPYRROLATE PF 0.2 MG/ML IJ SOSY
PREFILLED_SYRINGE | INTRAMUSCULAR | Status: DC | PRN
  Administered 2021-01-14: .2 mg via INTRAVENOUS

## 2021-01-14 MED ORDER — LACTATED RINGERS IV SOLN: INTRAVENOUS | Status: DC | PRN

## 2021-01-14 MED ORDER — MIDAZOLAM HCL 5 MG/5ML IJ SOLN
INTRAMUSCULAR | Status: DC | PRN
  Administered 2021-01-14: 2 mg via INTRAVENOUS

## 2021-01-14 MED ORDER — DEXAMETHASONE SODIUM PHOSPHATE 10 MG/ML IJ SOLN
INTRAMUSCULAR | Status: DC | PRN
  Administered 2021-01-14: 10 mg via INTRAVENOUS

## 2021-01-14 MED ORDER — ONDANSETRON HCL 4 MG/2ML IJ SOLN
INTRAMUSCULAR | Status: DC | PRN
  Administered 2021-01-14: 4 mg via INTRAVENOUS

## 2021-01-14 MED ORDER — LIDOCAINE 2% (20 MG/ML) 5 ML SYRINGE
INTRAMUSCULAR | Status: DC | PRN
  Administered 2021-01-14: 100 mg via INTRAVENOUS

## 2021-01-14 MED ORDER — EPHEDRINE SULFATE-NACL 50-0.9 MG/10ML-% IV SOSY
PREFILLED_SYRINGE | INTRAVENOUS | Status: DC | PRN
  Administered 2021-01-14: 10 mg via INTRAVENOUS

## 2021-01-14 MED ORDER — DEXMEDETOMIDINE HCL IN NACL 200 MCG/50ML IV SOLN
0.4000 ug/kg/h | INTRAVENOUS | Status: DC
  Filled 2021-01-14: qty 50

## 2021-01-14 NOTE — Progress Notes (Signed)
Patient in phase 2, ready for discharge. Awaiting ride arrival.

## 2021-01-14 NOTE — Transfer of Care (Signed)
Immediate Anesthesia Transfer of Care Note  Patient: Sean Burch  Procedure(s) Performed: VIDEO BRONCHOSCOPY WITH ENDOBRONCHIAL ULTRASOUND (N/A ) BRONCHIAL WASHINGS BRONCHIAL NEEDLE ASPIRATION BIOPSIES  Patient Location: PACU  Anesthesia Type:General  Level of Consciousness: awake and drowsy  Airway & Oxygen Therapy: Patient Spontanous Breathing and Patient connected to face mask oxygen  Post-op Assessment: Report given to RN and Post -op Vital signs reviewed and stable  Post vital signs: Reviewed and stable  Last Vitals:  Vitals Value Taken Time  BP 134/113 01/14/21 1020  Temp    Pulse 88 01/14/21 1022  Resp 15 01/14/21 1022  SpO2 98 % 01/14/21 1022  Vitals shown include unvalidated device data.  Last Pain:  Vitals:   01/14/21 0701  TempSrc:   PainSc: 0-No pain         Complications: No complications documented.

## 2021-01-14 NOTE — Anesthesia Postprocedure Evaluation (Signed)
Anesthesia Post Note  Patient: DERICO MITTON  Procedure(s) Performed: VIDEO BRONCHOSCOPY WITH ENDOBRONCHIAL ULTRASOUND (N/A ) BRONCHIAL WASHINGS BRONCHIAL NEEDLE ASPIRATION BIOPSIES     Patient location during evaluation: PACU Anesthesia Type: General Level of consciousness: awake and alert Pain management: pain level controlled Vital Signs Assessment: post-procedure vital signs reviewed and stable Respiratory status: spontaneous breathing, nonlabored ventilation, respiratory function stable and patient connected to nasal cannula oxygen Cardiovascular status: blood pressure returned to baseline and stable Postop Assessment: no apparent nausea or vomiting Anesthetic complications: no   No complications documented.  Last Vitals:  Vitals:   01/14/21 1050 01/14/21 1105  BP: 100/70 105/66  Pulse: 74 76  Resp: 15 15  Temp:  36.5 C  SpO2: 94% 95%    Last Pain:  Vitals:   01/14/21 1105  TempSrc:   PainSc: 0-No pain                 Earl Lites P Neeya Prigmore

## 2021-01-14 NOTE — Interval H&P Note (Signed)
History and Physical Interval Note:  01/14/2021 7:41 AM  Sean Burch  has presented today for surgery, with the diagnosis of MEDIASTINAL ADENOPATHY.  The various methods of treatment have been discussed with the patient and family. After consideration of risks, benefits and other options for treatment, the patient has consented to  Procedure(s): VIDEO BRONCHOSCOPY WITH ENDOBRONCHIAL ULTRASOUND (N/A) as a surgical intervention.  The patient's history has been reviewed, patient examined, no change in status, stable for surgery.  I have reviewed the patient's chart and labs.  Questions were answered to the patient's satisfaction.     Chi Mechele Collin

## 2021-01-14 NOTE — Anesthesia Procedure Notes (Signed)
Procedure Name: LMA Insertion Date/Time: 01/14/2021 8:14 AM Performed by: Nils Pyle, CRNA Pre-anesthesia Checklist: Patient identified, Emergency Drugs available, Suction available and Patient being monitored Patient Re-evaluated:Patient Re-evaluated prior to induction Oxygen Delivery Method: Circle System Utilized Preoxygenation: Pre-oxygenation with 100% oxygen Induction Type: IV induction Ventilation: Mask ventilation without difficulty LMA: LMA inserted LMA Size: 5.0 Number of attempts: 1 Airway Equipment and Method: Bite block Placement Confirmation: positive ETCO2 Tube secured with: Tape Dental Injury: Teeth and Oropharynx as per pre-operative assessment

## 2021-01-14 NOTE — Op Note (Signed)
[facility] Cardiopulmonary Patient Name: Sean Burch Pocedure Date: 01/14/2021 MRN: [id number] Attending MD: Rodman Pickle , MD Date of Birth: May 31, 1981 CSN: Finalized Age: 40 Admit Type: Outpatient Gender: Male Procedure:             Bronchoscopy Indications:           Mediastinal adenopathy, Paratracheal adenopathy Providers:             Rodman Pickle, MD, Glori Bickers, RN, Tyrone Apple,                         Technician, Cletis Athens, Technician, Garrison Columbus, CRNA Referring MD:           Medicines:             See the Anesthesia note for documentation of the                         administered medications Complications:         None                        No immediate complications Estimated Blood Loss:  Minimal <10cc                        Estimated blood loss was minimal. Procedure:             Pre-Anesthesia Assessment:                        - A History and Physical has been performed. Patient                         meds and allergies have been reviewed. The risks and                         benefits of the procedure and the sedation options and                         risks were discussed with the patient. All questions                         were answered and informed consent was obtained.                         Patient identification and proposed procedure were                         verified prior to the procedure by the physician in                         the pre-procedure area. Mental Status Examination:                         alert and oriented. Airway Examination: normal                         oropharyngeal airway. Respiratory Examination: clear                         to auscultation. CV Examination: RRR,  no murmurs, no                         S3 or S4. ASA Grade Assessment: I - A normal healthy                         patient. After reviewing the risks and benefits, the                         patient was deemed in satisfactory condition  to                         undergo the procedure. The anesthesia plan was to use                         general anesthesia. Immediately prior to                         administration of medications, the patient was                         re-assessed for adequacy to receive sedatives. The                         heart rate, respiratory rate, oxygen saturations,                         blood pressure, adequacy of pulmonary ventilation, and                         response to care were monitored throughout the                         procedure. The physical status of the patient was                         re-assessed after the procedure.                        After obtaining informed consent, the bronchoscope was                         passed under direct vision. Throughout the procedure,                         the patient's blood pressure, pulse, and oxygen                         saturations were monitored continuously. the [id number]                         ([id number]) Olympus Therapeutic Bronchoscope was                         introduced through the mouth, via laryngeal mask                         airway and advanced to the tracheobronchial tree. the  BF-[id number] ([id number]) Olympus EBUS scope was introduced                         through the and advanced to the. The patient tolerated                         the procedure. Scope In: Scope Out: Findings:      The laryngeal mask airway is in good position. The vocal cords appear       normal. The subglottic space is normal. The trachea is of normal       caliber. The carina is sharp. The tracheobronchial tree was examined to       at least the first subsegmental level. Bronchial anatomy are normal;       there are no endobronchial lesions. Minimal secretions present       throughout airway with mild erythema. Secretions suctioned and cleared.      Bronchoalveolar lavage was performed in the RML medial segment  (B5) of       the lung and sent for cell count, bacterial culture, viral smears &       culture, and fungal & AFB analysis and cytology. 60 mL of fluid were       instilled. 20 mL were returned. The return was blood-tinged and cloudy.       There were no mucoid plugs in the return fluid.      EBUS bronchoscope was used to for tranbronchial needle aspiration of 4R       (~88m) x 3 and and 7 (~1528m x 3. Station 2R was visualized and <28m27m     width. Sampling was attempted at 2R however aborted due to difficult       angle. After tissue sampling, airways were suctioned and cleared of       bloody secretions. No evidence of active bleeding. Bronschoscope was       withdrawn. Impression:            S/p tranbronchial needle aspiration of station 4R and                         station 7. S/p bronchial alveolar lavage of medial                         branch of right middle lobe Moderate Sedation:      See anesthesia note Recommendation:        Follow-up cytology. Follow-up BAL Procedure Code(s):     --- Professional ---                        [phone], rigid or flexible, including                         fluoroscopic guidance, when performed; with bronchial                         alveolar lavage Diagnosis Code(s):     --- Professional ---                        R59.0, Localized enlarged lymph nodes CPT copyright 2019 [employer]. All rights reserved. The codes documented in this report are preliminary and upon coder review  may  be revised to meet current compliance requirements. Rodman Pickle, MD 01/14/2021 10:18:02 AM This report has been signed electronically. Number of Addenda: 0

## 2021-01-16 ENCOUNTER — Encounter (HOSPITAL_COMMUNITY): Payer: Self-pay | Admitting: Pulmonary Disease

## 2021-01-16 LAB — CULTURE, RESPIRATORY W GRAM STAIN: Culture: NORMAL

## 2021-01-16 LAB — CYTOLOGY - NON PAP

## 2021-01-16 LAB — ACID FAST SMEAR (AFB, MYCOBACTERIA): Acid Fast Smear: NEGATIVE

## 2021-01-21 ENCOUNTER — Ambulatory Visit (INDEPENDENT_AMBULATORY_CARE_PROVIDER_SITE_OTHER): Payer: 59 | Admitting: Pulmonary Disease

## 2021-01-21 ENCOUNTER — Other Ambulatory Visit: Payer: Self-pay

## 2021-01-21 ENCOUNTER — Encounter: Payer: Self-pay | Admitting: Pulmonary Disease

## 2021-01-21 DIAGNOSIS — R591 Generalized enlarged lymph nodes: Secondary | ICD-10-CM

## 2021-01-21 DIAGNOSIS — D860 Sarcoidosis of lung: Secondary | ICD-10-CM

## 2021-01-21 NOTE — Progress Notes (Signed)
Virtual Visit via Telephone Note  I connected with Sean Burch on 01/21/21 at 12:00 PM EDT by telephone and verified that I am speaking with the correct person using two identifiers.  Location: Patient: Home Provider: [facility]   I discussed the limitations, risks, security and privacy concerns of performing an evaluation and management service by telephone and the availability of in person appointments. I also discussed with the patient that there may be a patient responsible charge related to this service. The patient expressed understanding and agreed to proceed.   History of Present Illness:  [name] is a 40 year old male current smoker who presents for telephone follow-up. He recently had bronchoscopy on 01/14/21. He denies any post-procedure complications. Denies cough, hemoptysis, wheezing or shortness of breath. He still has bloating, gas and abdominal discomfort. He has had some relief with PPI use but symptoms remain persistent.   Observations/Objective:  No respiratory distress noted on phone. No wheezing or cough. Able to complete full sentences  Cytology - Lymph Node 4R: No malignant cells. Granulomatous inflammation       Lymph Node 7: No malignant cells  Assessment and Plan: Discussed bronchoscopy results consistent with diagnosis of pulmonary sarcoid. With his GI complaints, will place referral for general work-up as well as evaluation for abdominal lymph nodes. If GI involvement of sarcoid can be confirmed, I would consider steroid therapy only if conservative management fails.  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   - Recommend annual ophthalmology exam - Will discuss with GI and place referral  - Will need EKG at next visit   Follow Up Instructions: Pulmonary function test will be ordered. Follow-up with me after  test Gastroenterology referral will be placed   I discussed the assessment and treatment plan with the patient. The patient was provided an opportunity to ask questions and all were answered. The patient agreed with the plan and demonstrated an understanding of the instructions.   The patient was advised to call back or seek an in-person evaluation if the symptoms worsen or if the condition fails to improve as anticipated.  I provided 25 minutes of non-face-to-face time during this encounter.   Sean Murin Mechele Collin, MD

## 2021-01-23 ENCOUNTER — Other Ambulatory Visit (HOSPITAL_COMMUNITY): Payer: 59

## 2021-01-25 ENCOUNTER — Other Ambulatory Visit (HOSPITAL_COMMUNITY)
Admission: RE | Admit: 2021-01-25 | Discharge: 2021-01-25 | Disposition: A | Discharge status: Home / Self Care | Payer: 59 | Source: Ambulatory Visit | Attending: Pulmonary Disease | Admitting: Pulmonary Disease

## 2021-01-25 DIAGNOSIS — Z20822 Contact with and (suspected) exposure to covid-19: Secondary | ICD-10-CM | POA: Diagnosis not present

## 2021-01-25 DIAGNOSIS — Z01812 Encounter for preprocedural laboratory examination: Secondary | ICD-10-CM | POA: Insufficient documentation

## 2021-01-25 LAB — SARS CORONAVIRUS 2 (TAT 6-24 HRS): SARS Coronavirus 2: NEGATIVE

## 2021-01-28 ENCOUNTER — Ambulatory Visit (INDEPENDENT_AMBULATORY_CARE_PROVIDER_SITE_OTHER): Payer: 59 | Admitting: Pulmonary Disease

## 2021-01-28 ENCOUNTER — Other Ambulatory Visit: Payer: Self-pay

## 2021-01-28 DIAGNOSIS — D860 Sarcoidosis of lung: Secondary | ICD-10-CM

## 2021-01-28 LAB — PULMONARY FUNCTION TEST
DL/VA % pred: 93 %
DL/VA: 4.25 ml/min/mmHg/L
DLCO cor % pred: 80 %
DLCO cor: 31.05 ml/min/mmHg
DLCO unc % pred: 80 %
DLCO unc: 31.22 ml/min/mmHg
FEF 25-75 Post: 4.85 L/sec
FEF 25-75 Pre: 4.77 L/sec
FEF2575-%Change-Post: 1 %
FEF2575-%Pred-Post: 106 %
FEF2575-%Pred-Pre: 105 %
FEV1-%Change-Post: 1 %
FEV1-%Pred-Post: 104 %
FEV1-%Pred-Pre: 102 %
FEV1-Post: 4.7 L
FEV1-Pre: 4.63 L
FEV1FVC-%Change-Post: 2 %
FEV1FVC-%Pred-Pre: 97 %
FEV6-%Change-Post: -1 %
FEV6-%Pred-Post: 105 %
FEV6-%Pred-Pre: 106 %
FEV6-Post: 5.72 L
FEV6-Pre: 5.79 L
FEV6FVC-%Pred-Post: 101 %
FEV6FVC-%Pred-Pre: 101 %
FVC-%Change-Post: -1 %
FVC-%Pred-Post: 103 %
FVC-%Pred-Pre: 104 %
FVC-Post: 5.72 L
FVC-Pre: 5.79 L
Post FEV1/FVC ratio: 82 %
Post FEV6/FVC ratio: 100 %
Pre FEV1/FVC ratio: 80 %
Pre FEV6/FVC Ratio: 100 %
RV % pred: 73 %
RV: 1.6 L
TLC % pred: 86 %
TLC: 7.2 L

## 2021-01-28 NOTE — Patient Instructions (Signed)
Pulmonary function test performed today. 

## 2021-01-28 NOTE — Progress Notes (Signed)
Full PFT performed today. °

## 2021-02-14 LAB — FUNGUS CULTURE WITH STAIN

## 2021-02-14 LAB — FUNGAL ORGANISM REFLEX

## 2021-02-14 LAB — FUNGUS CULTURE RESULT

## 2021-03-01 LAB — ACID FAST CULTURE WITH REFLEXED SENSITIVITIES (MYCOBACTERIA): Acid Fast Culture: NEGATIVE

## 2021-03-01 NOTE — Progress Notes (Signed)
Called and left message on voicemail to please return phone call to go over PFT results. Contact number provided.  Notified [facility] and Tasia called GI provider office who stated they were going to call patient today to schedule appointment.

## 2021-03-01 NOTE — Progress Notes (Signed)
Staff, GI referral has not been scheduled. Please schedule patient with Dr. Meridee Score regarding feasibility of sampling of abdominal lymph nodes. I have previously discussed case with him however any provider is ok. Please also schedule patient with follow-up with me in 4 months.

## 2021-03-04 ENCOUNTER — Telehealth: Payer: Self-pay | Admitting: Pulmonary Disease

## 2021-03-04 NOTE — Telephone Encounter (Signed)
[name], Wieck new! Your pulmonary function tests are normal. With your history of sarcoid, we will need perform breathing tests every 1-2 years based on your symptoms. The referral from GI has been placed. Please call our office if you have not been scheduled by the end of next week.  Sincerely, Mechele Collin, MD  Lm for patient.

## 2021-03-06 ENCOUNTER — Other Ambulatory Visit: Payer: Self-pay

## 2021-03-06 DIAGNOSIS — R591 Generalized enlarged lymph nodes: Secondary | ICD-10-CM

## 2021-03-06 NOTE — Progress Notes (Signed)
Attempted to obtain medical history via telephone, unable to reach at this time. I left a voicemail to return pre surgical testing department's phone call.  

## 2021-03-06 NOTE — Telephone Encounter (Signed)
Lmtcb for pt.  

## 2021-03-07 ENCOUNTER — Other Ambulatory Visit (HOSPITAL_COMMUNITY): Payer: 59

## 2021-03-11 ENCOUNTER — Ambulatory Visit (HOSPITAL_COMMUNITY): Admission: RE | Admit: 2021-03-11 | Payer: 59 | Source: Home / Self Care | Admitting: Gastroenterology

## 2021-03-11 ENCOUNTER — Encounter (HOSPITAL_COMMUNITY): Admission: RE | Payer: Self-pay | Source: Home / Self Care

## 2021-03-11 ENCOUNTER — Telehealth: Payer: Self-pay

## 2021-03-11 ENCOUNTER — Encounter (HOSPITAL_COMMUNITY): Payer: Self-pay | Admitting: Anesthesiology

## 2021-03-11 SURGERY — UPPER ENDOSCOPIC ULTRASOUND (EUS) RADIAL
Anesthesia: Monitor Anesthesia Care

## 2021-03-11 NOTE — Telephone Encounter (Signed)
-----   Message from Lemar Lofty., MD sent at 03/11/2021  8:45 AM EDT ----- Clydie Braun, Thank you for the update.  I am sorry that he was not able to get the COVID test.  Jaquesha Boroff, please look for another slot with DJ or myself in the coming weeks for attempt at sampling of intra-abdominal lymph nodes to further confirm sarcoidosis. FYI Dr. Everardo All. GM ----- Message ----- From: Viviana Simpler, RN Sent: 03/11/2021   8:24 AM EDT To: Lemar Lofty., MD  Patient cancelled due to no covid test see note

## 2021-03-11 NOTE — Telephone Encounter (Signed)
Lmtcb for pt.  

## 2021-03-11 NOTE — Progress Notes (Signed)
Patient did not have covid test for procedure scheduled today. Attempted to call. Pt did not answer. Left message stating that since patient did not have covid test his procedure would be cancelled. He should call dr. Inda Merlin office to reschedule. Left [phone], and [phone]

## 2021-03-12 ENCOUNTER — Other Ambulatory Visit: Payer: Self-pay

## 2021-03-12 DIAGNOSIS — R591 Generalized enlarged lymph nodes: Secondary | ICD-10-CM

## 2021-03-12 NOTE — Telephone Encounter (Signed)
Left message on machine to call back  

## 2021-03-12 NOTE — Telephone Encounter (Signed)
EUS scheduled for 04/02/21 at 1145 am at [facility] with Dr Meridee Score.  COVID test on 03/29/21

## 2021-03-13 NOTE — Telephone Encounter (Signed)
Left message on machine to call back  

## 2021-03-14 NOTE — Telephone Encounter (Signed)
Left message on machine to call back  

## 2021-03-15 NOTE — Telephone Encounter (Signed)
EUS scheduled, pt instructed and medications reviewed.  Patient instructions mailed to home.  Patient to call with any questions or concerns.  

## 2021-03-18 ENCOUNTER — Telehealth: Payer: Self-pay | Admitting: Pulmonary Disease

## 2021-03-18 NOTE — Telephone Encounter (Signed)
Attempted to call pt but unable to reach. Left message for him to return call. Due to multiple attempts trying to reach pt and have not been able to do so, encounter will be closed per triage protocol.

## 2021-03-18 NOTE — Telephone Encounter (Signed)
Your pulmonary function tests are normal.    I have called the pt and he is aware of results.     Pt stated that he is feeling bloated in his stomach for the last week.  He c/o discomfort at the top of his stomach and this is worse after he eats.  He is scheduled for a procedure with GI on 5/31.    He stated that with increased walking he has a discomfort in his chest.  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  The last few days has been more difficult for him.  He just wanted to check with JE and see what this could be.  Please advise. Thanks

## 2021-03-20 ENCOUNTER — Encounter (HOSPITAL_COMMUNITY): Payer: Self-pay | Admitting: Gastroenterology

## 2021-03-20 ENCOUNTER — Encounter (HOSPITAL_COMMUNITY): Payer: Self-pay | Admitting: Anesthesiology

## 2021-03-20 ENCOUNTER — Encounter (HOSPITAL_COMMUNITY): Admission: RE | Payer: Self-pay | Source: Home / Self Care

## 2021-03-20 ENCOUNTER — Ambulatory Visit (HOSPITAL_COMMUNITY): Admission: RE | Admit: 2021-03-20 | Payer: 59 | Source: Home / Self Care | Admitting: Gastroenterology

## 2021-03-20 SURGERY — UPPER ENDOSCOPIC ULTRASOUND (EUS) RADIAL
Anesthesia: Monitor Anesthesia Care

## 2021-03-20 MED ORDER — SODIUM CHLORIDE 0.9 % IV SOLN: INTRAVENOUS | Status: DC

## 2021-03-20 NOTE — Anesthesia Preprocedure Evaluation (Deleted)
Anesthesia Evaluation    Airway        Dental   Pulmonary pneumonia, resolved, Current Smoker,  Pulmonary sarcoid- on prednisone          Cardiovascular negative cardio ROS       Neuro/Psych PSYCHIATRIC DISORDERS Anxiety Panic attacksnegative neurological ROS     GI/Hepatic Neg liver ROS, GERD  ,  Endo/Other  Obesity  Renal/GU negative Renal ROS  negative genitourinary   Musculoskeletal negative musculoskeletal ROS (+)   Abdominal   Peds  Hematology Lymphadenopathy   Anesthesia Other Findings   Reproductive/Obstetrics                            Anesthesia Physical Anesthesia Plan  ASA: II  Anesthesia Plan: MAC   Post-op Pain Management:    Induction: Intravenous  PONV Risk Score and Plan: 0 and Treatment may vary due to age or medical condition and Propofol infusion  Airway Management Planned: Natural Airway and Nasal Cannula  Additional Equipment:   Intra-op Plan:   Post-operative Plan:   Informed Consent:   Plan Discussed with:   Anesthesia Plan Comments:         Anesthesia Quick Evaluation

## 2021-03-26 NOTE — Telephone Encounter (Signed)
appt scheduled for 04/30/21 at 9:00. Patient is aware and voiced his understanding.  Nothing further needed at this time.

## 2021-03-26 NOTE — Telephone Encounter (Signed)
Please schedule patient with me in 1-2 months - JE

## 2021-03-29 ENCOUNTER — Other Ambulatory Visit (HOSPITAL_COMMUNITY): Payer: 59

## 2021-04-25 ENCOUNTER — Telehealth: Payer: Self-pay | Admitting: Pulmonary Disease

## 2021-04-25 NOTE — Telephone Encounter (Signed)
Primary Pulmonologist: Dr. Everardo All Last office visit and with whom: 01/21/21 Dr. Everardo All What do we see them for (pulmonary problems): sarcoidosis Last OV assessment/plan:  Follow Up Instructions: Pulmonary function test will be ordered. Follow-up with me after test Gastroenterology referral will be placed   I discussed the assessment and treatment plan with the patient. The patient was provided an opportunity to ask questions and all were answered. The patient agreed with the plan and demonstrated an understanding of the instructions.   The patient was advised to call back or seek an in-person evaluation if the symptoms worsen or if the condition fails to improve as anticipated.   I provided 25 minutes of non-face-to-face time during this encounter.     Sean Mechele Collin, MD  Was appointment offered to patient (explain)?  No- Patient scheduled f/u with Dr. Everardo All 04/30/21   Reason for call: Called and spoke with Patient.  Patient stated he has been having on and off sob for past week.  Patient stated 3 days ago he felt bad, but the next day he was fine.  Patient stated that yesterday he felt bloated and experienced increased belching, off and on.  Patient stated when he went into work last night he felt chest tightness and was having increased sob.  Patient stated he left work.  Patient stated he walked 3/4 of a mile home and was experiencing sob, difficulty breathing, and chest tightness. Patient stated when he got home, he laid down and that relieved his symptoms. Patient denied any cough, chest pains, left arm, or jaw pains.  Patient stated he did not have any type of rescue inhaler. Patient is scheduled next week with Dr. Everardo All.   Message routed to Tammy, NP  ?o Known Allergies  Immunization History  Administered Date(s) Administered   Tdap 06/23/2013

## 2021-04-25 NOTE — Telephone Encounter (Signed)
Patient is having exertional chest pain . He has sarcoid .  Worry this could be cardiac related.  No clinic openings today  Would recommend ER evaluation as could be cardiac  If neg ER w/up can get ov in next couple of weeks for follow up    Please contact office for sooner follow up if symptoms do not improve or worsen or seek emergency care

## 2021-04-25 NOTE — Telephone Encounter (Signed)
Called and spoke with Patient.  Tammy,NP recommendations given.  Understanding stated. Nothing further at this time. 

## 2021-04-30 ENCOUNTER — Ambulatory Visit (INDEPENDENT_AMBULATORY_CARE_PROVIDER_SITE_OTHER): Payer: 59 | Admitting: Pulmonary Disease

## 2021-04-30 ENCOUNTER — Other Ambulatory Visit: Payer: Self-pay

## 2021-04-30 VITALS — BP 118/76 | HR 66 | Temp 97.9°F | Ht 77.0 in | Wt 266.2 lb

## 2021-04-30 DIAGNOSIS — D860 Sarcoidosis of lung: Secondary | ICD-10-CM | POA: Diagnosis not present

## 2021-04-30 MED ORDER — ALBUTEROL SULFATE HFA 108 (90 BASE) MCG/ACT IN AERS: 2.0000 | INHALATION_SPRAY | RESPIRATORY_TRACT | 6 refills | Status: AC | PRN

## 2021-04-30 NOTE — Patient Instructions (Addendum)
Pulmonary sarcoidosis --START Albuterol TWO puffs AS NEEDED for shortness of breath or chest tightness --EKG today --Re-refer to GI  Reflux --START omeprazole 20 mg twice a day for two weeks and then 20 mg once a day   Follow-up with me in 3 months

## 2021-04-30 NOTE — Progress Notes (Signed)
Subjective:   PATIENT ID: Sean Burch GENDER: male DOB: [birth year], MRN: [id number]   HPI  Chief Complaint  Patient presents with   Follow-up    Pulmonary sarcoidosis . Patient states he had some SHOB for about 1 week recently about 1 week ago. Intermittent chest pressure (discomfort) ongoing.     Reason for Visit: F/u sarcoid  [name] is a 40 year old male current smoker with pulmonary sarcoidosis who presents for follow-up.  Synopsis: On initial consult, he presented to the ED on 12/15/20 for gradually worsening bilaterally abdominal pain over six hour period that began lower chest/upper abdominal pain associated with belching that progressed into a panic attack. After discharge his chest/abdominal pain dissipated after two days.After that he denies any recurrent chest pain/abdominal pain. He reports a two week history of intermittent loose stools, gas, bloating, belching. Denies hematochezia or melena.   Since our last visit, he was diagnosed pulmonary sarcoidosis via bronchoscopy. He was referred to GI for biopsy as well however was unable to make it to his appointments because he had not been checking his mychart. 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  Overall he had been doing well until around 10 days ago, he had called the office for an episode of upper chest pressure/tightness bilaterally which worsened with his anxiety lasting for 3-4 days ago. This was preceded by 24 hours of not sleeping. This was followed by abdominal upset with persistent belching.   At baseline he has does not have any restrictions on his activity but he has a daily walk to work and does notice around two months ago some mild chest tightness/pressure after walking uphill to get home but would self-resolve after 30 seconds.  Social History: Active smoker. Currently 1 ppd x 15 years.  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  I have personally reviewed patient's past medical/family/social history, allergies, current medications.  Past Medical History:  Diagnosis Date   Anxiety    GERD (gastroesophageal reflux disease)    Irregular heart rate    Pneumonia    Stuttering      Family History  Problem Relation Age of Onset   Sarcoidosis Sister      Social History   Occupational History   Not on file  Tobacco Use   Smoking status: Every Day    Packs/day: 1.00    Pack years: 0.00    Types: Cigarettes   Smokeless tobacco: Never  Vaping Use   Vaping Use: Former  Substance and Sexual Activity   Alcohol use: Not Currently    Comment: socially   Drug use: No   Sexual activity: Not on file    No Known Allergies   Outpatient Medications Prior to Visit  Medication Sig Dispense Refill   amoxicillin-clavulanate (AUGMENTIN) 500-125 MG tablet Take 1 tablet by mouth 2 (two) times daily.     Multiple Vitamin (MULTIVITAMIN WITH MINERALS) TABS tablet Take 1 tablet by mouth daily.     predniSONE (DELTASONE) 20 MG tablet Take 20 mg by mouth daily with breakfast.     No facility-administered medications prior to visit.    Review of Systems  Constitutional:  Negative for chills, diaphoresis, fever, malaise/fatigue and weight loss.  HENT:  Negative for congestion.   Respiratory:  Positive for shortness of breath. Negative for cough, hemoptysis, sputum  production and wheezing.   Cardiovascular:  Positive for chest pain. Negative for palpitations and leg swelling.  Gastrointestinal:  Positive for abdominal pain and heartburn.  Psychiatric/Behavioral:  The patient is nervous/anxious.     Objective:   Vitals:   04/30/21 0918  BP: 118/76  Pulse: 66  Temp: 97.9 F (36.6 C)  TempSrc: Oral  SpO2: 100%  Weight: 266 lb 4 oz (120.8 kg)  Height: 6\' 5"  (1.956 m)      Physical Exam: General: Well-appearing, no acute distress HENT:  Pala, AT Eyes: EOMI, no scleral icterus Respiratory: Clear to auscultation bilaterally.  No crackles, wheezing or rales Cardiovascular: RRR, -M/R/G, no JVD GI: BS+, soft, nontender Extremities:-Edema,-tenderness Neuro: AAO x4, CNII-XII grossly intact Skin: Intact, no rashes or bruising Psych: Normal mood, normal affect   Data Reviewed:  Imaging: CTA 12/15/20 - Centrilobular emphysema with upper lobe involvement. Borderline enlargement of mediastinal lymph nodes. No hilar adenopathy. No infiltrate, effusion or edema. Small upper abdominal lymph nodes including on the gastrohepatic ligament noted.  PFT: 01/28/21 FVC 5.72 (103%) FEV1 4.7 (104%) Ratio 99  TLC 86% DLCO 80% Interpretation: Normal PFTs  Labs: CBC    Component Value Date/Time   WBC 5.6 01/14/2021 0721   RBC 5.45 01/14/2021 0721   HGB 14.8 01/14/2021 0721   HCT 44.6 01/14/2021 0721   PLT 242 01/14/2021 0721   MCV 81.8 01/14/2021 0721   MCH 27.2 01/14/2021 0721   MCHC 33.2 01/14/2021 0721   RDW 14.9 01/14/2021 0721   LYMPHSABS 1.7 12/15/2020 0539   MONOABS 0.7 12/15/2020 0539   EOSABS 0.1 12/15/2020 0539   BASOSABS 0.0 12/15/2020 0539   CMP Latest Ref Rng & Units 01/14/2021 12/15/2020 07/23/2020  Glucose 70 - 99 mg/dL 07/25/2020) 539(J) 673(A)  BUN 6 - 20 mg/dL 7 10 5(L)  Creatinine 193(X - 1.24 mg/dL 9.02 4.09 7.35  Sodium 135 - 145 mmol/L 137 137 139  Potassium 3.5 - 5.1 mmol/L 4.1 3.5 3.7  Chloride 98 - 111 mmol/L 104 104 102  CO2 22 - 32 mmol/L 25 24 26   Calcium 8.9 - 10.3 mg/dL 9.2 8.9 9.0  Total Protein 6.5 - 8.1 g/dL 7.2 7.6 7.5  Total Bilirubin 0.3 - 1.2 mg/dL 0.7 0.4 0.9  Alkaline Phos 38 - 126 U/L 194(H) 172(H) 222(H)  AST 15 - 41 U/L 50(H) 32 41  ALT 0 - 44 U/L 113(H) 57(H) 63(H)   Pathology Liver biopsy 10/03/20 The biopsy shows liver with a well-preserved architecture. Overall,  portal tracts alternate fairly regularly with central veins and normally  radiating hepatocellular trabeculae. Portal tracts  show mild, mostly  mononuclear inflammatory cell infiltrate with patchy mild interface  hepatitis.  Ceroid-laden macrophages are present.  A few bile ducts  appear to be missing from larger portal tracts but there is no  ductopenia.  Portal vascular structures are histologically unremarkable.  Hepatic lobules show mild, mostly large droplet macrovesicular steatosis  without evidence of steatohepatitis.  Scattered foci of lobular  necroinflammation with rare apoptotic bodies are identified.  There is  no cholestasis.  Some of the larger portal tracts appear expanded on  trichrome and reticulin stains. Iron stain shows mild granular iron  deposition within Kupffer cells, consistent with mild secondary iron  overload. PASD stain is negative for globular intracytoplasmic  inclusions.  The histologic findings are nonspecific but drug-induced  liver injury needs to be considered.      Assessment & Plan:   Discussion: 40 year old male current smoker  with pulmonary sarcoidosis with possible GI involvement (enlarged mediastinal and abdominal lymphadenopathy). Recent evaluation for abdominal pain and persistently elevated AP. Abdominal US neg. Liver biopsy with non-specific findings and no comment on presence of granulomas. Unclear what the cause of his lymphadenopathy is related.   Pulmonary sarcoidosis with possible GI involvement - Dx in 01/2021 via endobronchial bx - No indication for prednisone therapy at this time - START Albuterol TWO puffs AS NEEDED for shortness of breath or chest tightness - Will schedule annual PFTs.  2022- normal - Recommend annual ophthalmology exam - Will re-refer to GI and send Dr. Meridee Score epic message. - EKG reviewed. No acute findings. Consider Cardiology referral if GI work-up negative  Reflux --START omeprazole 20 mg twice a day for two weeks and then 20 mg once a day   Tobacco abuse Patient is an active smoker. We discussed smoking cessation for 3  minutes. We discussed triggers and stressors and ways to deal with them. We discussed barriers to continued smoking and benefits of smoking cessation. Provided patient with information cessation techniques and interventions including [place] quitline.   Health Maintenance Immunization History  Administered Date(s) Administered   Tdap 06/23/2013   CT Lung Screen - not indicated based on age  Orders Placed This Encounter  Procedures   Ambulatory referral to Gastroenterology    Referral Priority:   Routine    Referral Type:   Consultation    Referral Reason:   Specialty Services Required    Number of Visits Requested:   1   EKG 12-Lead   Meds ordered this encounter  Medications   albuterol (VENTOLIN HFA) 108 (90 Base) MCG/ACT inhaler    Sig: Inhale 2 puffs into the lungs as needed for wheezing or shortness of breath.    Dispense:  8 g    Refill:  6    Return in about 3 months (around 07/31/2021).  I have spent a total time of 35-minutes on the day of the appointment reviewing prior documentation, coordinating care and discussing medical diagnosis and plan with the patient/family. Imaging, labs and tests included in this note have been reviewed and interpreted independently by me.  Americus Scheurich Mechele Collin, MD [facility] 04/30/2021 9:16 AM  Office Number [phone]

## 2021-05-01 ENCOUNTER — Encounter: Payer: Self-pay | Admitting: Pulmonary Disease

## 2021-05-10 ENCOUNTER — Telehealth: Payer: Self-pay

## 2021-05-10 NOTE — Telephone Encounter (Signed)
-----   Message from Lemar Lofty., MD sent at 05/09/2021 11:42 PM EDT ----- Alexia Freestone, Please work on scheduling this patient for GI clinic with me.  Okay to overbook.  Once he is seen in clinic we will discuss and get him set up for potential EUS (he canceled on multiple occasions previously so we need to see before we rebook) and potentially consider further lab serologies and liver MRI. Thanks. GM ----- Message ----- From: Luciano Cutter, MD Sent: 05/01/2021   3:33 PM EDT To: Lemar Lofty., MD  Dr. Meridee Score, we had previously discussed potential biopsy for this patient to rule out abdominal sarcoid. I have also placed a referral for GI clinic for evaluation of atypical chest pain/reflux symptoms if he warrants any other evaluation. Started on PPI

## 2021-05-10 NOTE — Telephone Encounter (Signed)
The pt has been schedule for an office visit with GM on 05/24/21 at 930 am.  Message sent to the pt via My Chart

## 2021-05-24 ENCOUNTER — Other Ambulatory Visit (INDEPENDENT_AMBULATORY_CARE_PROVIDER_SITE_OTHER): Payer: 59

## 2021-05-24 ENCOUNTER — Ambulatory Visit (INDEPENDENT_AMBULATORY_CARE_PROVIDER_SITE_OTHER): Payer: 59 | Admitting: Gastroenterology

## 2021-05-24 ENCOUNTER — Encounter: Payer: Self-pay | Admitting: Gastroenterology

## 2021-05-24 VITALS — BP 120/70 | HR 88 | Ht 77.0 in | Wt 272.4 lb

## 2021-05-24 DIAGNOSIS — R7989 Other specified abnormal findings of blood chemistry: Secondary | ICD-10-CM

## 2021-05-24 DIAGNOSIS — A480 Gas gangrene: Secondary | ICD-10-CM

## 2021-05-24 DIAGNOSIS — R194 Change in bowel habit: Secondary | ICD-10-CM

## 2021-05-24 DIAGNOSIS — D860 Sarcoidosis of lung: Secondary | ICD-10-CM

## 2021-05-24 DIAGNOSIS — R590 Localized enlarged lymph nodes: Secondary | ICD-10-CM

## 2021-05-24 DIAGNOSIS — R141 Gas pain: Secondary | ICD-10-CM

## 2021-05-24 DIAGNOSIS — R21 Rash and other nonspecific skin eruption: Secondary | ICD-10-CM

## 2021-05-24 DIAGNOSIS — R142 Eructation: Secondary | ICD-10-CM

## 2021-05-24 DIAGNOSIS — R945 Abnormal results of liver function studies: Secondary | ICD-10-CM

## 2021-05-24 LAB — CBC
HCT: 43.7 % (ref 39.0–52.0)
Hemoglobin: 14.4 g/dL (ref 13.0–17.0)
MCHC: 32.9 g/dL (ref 30.0–36.0)
MCV: 82.1 fl (ref 78.0–100.0)
Platelets: 204 10*3/uL (ref 150.0–400.0)
RBC: 5.32 Mil/uL (ref 4.22–5.81)
RDW: 15.5 % (ref 11.5–15.5)
WBC: 5.6 10*3/uL (ref 4.0–10.5)

## 2021-05-24 LAB — COMPREHENSIVE METABOLIC PANEL
ALT: 51 U/L (ref 0–53)
AST: 25 U/L (ref 0–37)
Albumin: 4.3 g/dL (ref 3.5–5.2)
Alkaline Phosphatase: 149 U/L — ABNORMAL HIGH (ref 39–117)
BUN: 6 mg/dL (ref 6–23)
CO2: 27 mEq/L (ref 19–32)
Calcium: 9.3 mg/dL (ref 8.4–10.5)
Chloride: 105 mEq/L (ref 96–112)
Creatinine, Ser: 0.83 mg/dL (ref 0.40–1.50)
GFR: 110.65 mL/min (ref >60.00)
Glucose, Bld: 101 mg/dL — ABNORMAL HIGH (ref 70–99)
Potassium: 3.7 mEq/L (ref 3.5–5.1)
Sodium: 141 mEq/L (ref 135–145)
Total Bilirubin: 0.4 mg/dL (ref 0.2–1.2)
Total Protein: 7.6 g/dL (ref 6.0–8.3)

## 2021-05-24 LAB — SEDIMENTATION RATE: Sed Rate: 32 mm/hr — ABNORMAL HIGH (ref 0–15)

## 2021-05-24 LAB — PROTIME-INR
INR: 1.1 ratio — ABNORMAL HIGH (ref 0.8–1.0)
Prothrombin Time: 12.5 s (ref 9.6–13.1)

## 2021-05-24 LAB — C-REACTIVE PROTEIN: CRP: <1 mg/dL (ref 0.5–20.0)

## 2021-05-24 LAB — GAMMA GT: GGT: 314 U/L — ABNORMAL HIGH (ref 7–51)

## 2021-05-24 NOTE — Patient Instructions (Signed)
You have been scheduled for an endoscopy. Please follow written instructions given to you at your visit today. If you use inhalers (even only as needed), please bring them with you on the day of your procedure.  We have sent a referral to [facility] for your hand rash. Their office will contact you with an appointment. If you have not heard from them in 1-2 weeks, please contact our office and let us know.   Your provider has requested that you go to the basement level for lab work before leaving today. Press "B" on the elevator. The lab is located at the first door on the left as you exit the elevator.  Due to recent changes in healthcare laws, you may see the results of your imaging and laboratory studies on MyChart before your provider has had a chance to review them.  We understand that in some cases there may be results that are confusing or concerning to you. Not all laboratory results come back in the same time frame and the provider may be waiting for multiple results in order to interpret others.  Please give [place] 48 hours in order for your provider to thoroughly review all the results before contacting the office for clarification of your results.   Thank you for choosing me and  Gastroenterology.  Dr. Meridee Score

## 2021-05-24 NOTE — Progress Notes (Signed)
[place] VISIT   Primary Care Provider Care, [facility] [address] [place] [address] [phone]  Referring Provider Dr. Loanne Drilling ([facility])  Patient Profile: Sean Burch is a 40 y.o. male with a pmh significant for anxiety, stutter, likely pulmonary sarcoidosis,?GERD, chronic elevated LFTs.  The patient presents to the [facility] for an evaluation and management of problem(s) noted below:  Problem List 1. Abnormal LFTs   2. Abdominal lymphadenopathy   3. Belching   4. Abdominal gas pain   5. Change in bowel habits   6. Rash and nonspecific skin eruption   7. Pulmonary sarcoidosis ([place])     History of Present Illness This is the patient's first visit to the outpatient [place] clinic.  The patient does carry a diagnosis of likely pulmonary sarcoidosis.  Recent cross-sectional imaging of the chest/abdomen/pelvis found lymphadenopathy.  The patient was referred for consideration of endoscopic ultrasound but he missed 2 direct appointments.  Is for this reason the patient is being evaluated now in clinic prior to rescheduling an endoscopic ultrasound attempt.  Patient states that he has had issues with abdominal bloating and belching for years.  Things have worsened over the course last year and a half.  He actually ended up seeing a gastroenterologist for elevated alkaline phosphatase (Dr. Benson Norway).  It was Dr. Benson Norway who referred him eventually for a liver biopsy which was grossly unremarkable and suggested that she will daily for some steatosis (entire report below).  Has felt somewhat better.  His symptoms are not always prandial related but they can be at times.  He has bowel movements at least 2-3 times per day.  He has no blood in his stools.  He has never had an upper or lower endoscopy.  Patient does not take significant nonsteroidals or BC/Goody powders on a regular basis.  In the  past he has felt "GERD) when he has had chest discomfort and burning at times but currently does not feel that.  He is not taking any PPI or H2 RA therapy.  He just wants to feel better and is hopeful that we can get an answer to his symptoms.  GI Review of Systems Positive as above Negative for dysphagia, odynophagia, nausea, vomiting early satiety, decreased appetite, change in bowel habits  Review of Systems General: Denies fevers/chills/weight loss unintentionally HEENT: Denies oral lesions Cardiovascular: Denies current chest pain/palpitations Pulmonary: Denies shortness of breath/nocturnal cough Gastroenterological: See HPI Genitourinary: Denies darkened urine Hematological: Denies easy bruising/bleeding Endocrine: Denies temperature intolerance Dermatological: Denies jaundice Psychological: Mood is stable   Medications Current Outpatient Medications  Medication Sig Dispense Refill   Multiple Vitamin (MULTIVITAMIN WITH MINERALS) TABS tablet Take 1 tablet by mouth daily.     Probiotic Product (PROBIOTIC PO) Take 1 capsule by mouth daily.     Selenium 200 MCG CAPS Take 1 capsule by mouth daily at 2 am.     TURMERIC CURCUMIN PO Take by mouth.     albuterol (VENTOLIN HFA) 108 (90 Base) MCG/ACT inhaler Inhale 2 puffs into the lungs as needed for wheezing or shortness of breath. (Patient not taking: Reported on 05/24/2021) 8 g 6   No current facility-administered medications for this visit.    Allergies No Known Allergies  Histories Past Medical History:  Diagnosis Date   Anxiety    GERD (gastroesophageal reflux disease)    Irregular heart rate    Pneumonia    Stuttering  Past Surgical History:  Procedure Laterality Date   BRONCHIAL NEEDLE ASPIRATION BIOPSY  01/14/2021   Procedure: BRONCHIAL NEEDLE ASPIRATION BIOPSIES;  Surgeon: Margaretha Seeds, MD;  Location: [facility] ENDOSCOPY;  Service: Pulmonary;;   BRONCHIAL WASHINGS  01/14/2021   Procedure: BRONCHIAL WASHINGS;   Surgeon: Margaretha Seeds, MD;  Location: [facility] ENDOSCOPY;  Service: Pulmonary;;   KNEE ARTHROSCOPY W/ ACL RECONSTRUCTION     KNEE SURGERY     VIDEO BRONCHOSCOPY WITH ENDOBRONCHIAL ULTRASOUND N/A 01/14/2021   Procedure: VIDEO BRONCHOSCOPY WITH ENDOBRONCHIAL ULTRASOUND;  Surgeon: Margaretha Seeds, MD;  Location: [place];  Service: Pulmonary;  Laterality: N/A;   Social History   Socioeconomic History   Marital status: Single    Spouse name: Not on file   Number of children: Not on file   Years of education: Not on file   Highest education level: Not on file  Occupational History   Not on file  Tobacco Use   Smoking status: Every Day    Packs/day: 1.00    Types: Cigarettes   Smokeless tobacco: Never  Vaping Use   Vaping Use: Former  Substance and Sexual Activity   Alcohol use: Not Currently    Comment: socially   Drug use: No   Sexual activity: Not on file  Other Topics Concern   Not on file  Social History Narrative   ** Merged History Encounter **       Social Determinants of Health   Financial Resource Strain: Not on file  Food Insecurity: Not on file  Transportation Needs: Not on file  Physical Activity: Not on file  Stress: Not on file  Social Connections: Not on file  Intimate Partner Violence: Not on file   Family History  Problem Relation Age of Onset   Sarcoidosis Sister    Colon cancer Neg Hx    Esophageal cancer Neg Hx    Inflammatory bowel disease Neg Hx    Liver disease Neg Hx    Pancreatic cancer Neg Hx    Rectal cancer Neg Hx    Stomach cancer Neg Hx    I have reviewed his medical, social, and family history in detail and updated the electronic medical record as necessary.    PHYSICAL EXAMINATION  BP 120/70   Pulse 88   Ht 6' 5" (1.956 m)   Wt 272 lb 6.4 oz (123.6 kg)   BMI 32.30 kg/m  Wt Readings from Last 3 Encounters:  05/24/21 272 lb 6.4 oz (123.6 kg)  04/30/21 266 lb 4 oz (120.8 kg)  01/14/21 260 lb (117.9 kg)  GEN: NAD, appears  stated age, doesn't appear chronically ill PSYCH: Cooperative, without pressured speech EYE: Conjunctivae pink, sclerae anicteric ENT: MMM, without oral ulcers CV: RR without R/Gs  RESP: CTAB posteriorly, without wheezing GI: NABS, soft, protuberant abdomen, distended, nontender, without rebound or guarding, no HSM appreciated MSK/EXT: No lower extremity edema SKIN: Patient has a rash on his digits which comes and goes (would like a referral per his report; no jaundice, no spider angiomata NEURO:  Alert & Oriented x 3, no focal deficits, no evidence of asterixis   REVIEW OF DATA  I reviewed the following data at the time of this encounter:  GI Procedures and Studies  December 2021 liver biopsy FINAL MICROSCOPIC DIAGNOSIS:  A. LIVER, NEEDLE CORE BIOPSY:  - Liver parenchyma with mild steatosis and mild nonspecific lobular inflammation. See comment  - Patchy mild portal fibrosis  COMMENT:  The biopsy shows liver with  a well-preserved architecture. Overall, portal tracts alternate fairly regularly with central veins and normally radiating hepatocellular trabeculae. Portal tracts show mild, mostly mononuclear inflammatory cell infiltrate with patchy mild interface hepatitis.  Ceroid-laden macrophages are present.  A few bile ducts appear to be missing from larger portal tracts but there is no ductopenia.  Portal vascular structures are histologically unremarkable. Hepatic lobules show mild, mostly large droplet macrovesicular steatosis without evidence of steatohepatitis.  Scattered foci of lobular necroinflammation with rare apoptotic bodies are identified.  There is no cholestasis.  Some of the larger portal tracts appear expanded on trichrome and reticulin stains. Iron stain shows mild granular iron deposition within Kupffer cells, consistent with mild secondary iron overload. PASD stain is negative for globular intracytoplasmic inclusions.  The histologic findings are nonspecific but  drug-induced liver injury needs to be considered.  Laboratory Studies    11/06/2013 06:25 07/23/2020 21:21 12/15/2020 05:39 01/14/2021 07:21  AST 20 41 32 50 (H)  ALT 26 63 (H) 57 (H) 113 (H)  Alkaline Phosphatase 130 (H) 222 (H) 172 (H) 194 (H)  Total Bilirubin 0.4 0.9 0.4 0.7  Bilirubin, Direct   0.1   Indirect Bilirubin   0.3     Imaging Studies  November 2021 abdominal ultrasound IMPRESSION: Unremarkable sonographic evaluation of the abdomen. Normal appearance of the gallbladder.  February 2022 CT chest/abdomen/pelvis angiography IMPRESSION: 1. Normal aorta.  No arterial abnormality identified.  2. Indeterminate low level lymphadenopathy in the mediastinum, abdomen and pelvis. A lymphoproliferative disorder cannot be excluded, although there is no axillary or inguinal lymphadenopathy. Systemic process such as sarcoidosis would also be a consideration, although hilar lymph nodes are relatively spared. 3. No other acute or inflammatory process. Early upper lobe Emphysema (ICD10-J43.9).   ASSESSMENT  Mr. Donson is a 40 y.o. malewith a pmh significant for anxiety, stutter, likely pulmonary sarcoidosis,?GERD, chronic elevated LFTs.  The patient is seen today for evaluation and management of:  1. Abnormal LFTs   2. Abdominal lymphadenopathy   3. Belching   4. Abdominal gas pain   5. Change in bowel habits   6. Rash and nonspecific skin eruption   7. Pulmonary sarcoidosis ([place])    The patient is hemodynamically stable.  Clinically he seems to be doing better than based on his prior notation in the chart.  The etiology of his symptoms is not clearly defined.  He may have systemic sarcoidosis.  We will better define his symptoms by consideration of endoscopic ultrasound as well as endoscopic biopsies.  If we are able to reach the lymphadenopathy in the retroperitoneum, we will try to biopsy/sample this as it will be important, if it is found to be positive for sarcoidosis that a systemic  treatment will need to be considered.  In regards to further work-up of his symptoms as well as his abnormalities we will plan a basic serological work-up.  We will try to get Dr. Ulyses Amor work-up that has been done and depending on what the LFT pattern looks like decide on further evaluation thereafter.  Diagnostic endoscopy will be performed at time of endoscopic ultrasound.  For the patient's digit rash, we will place a referral to dermatology.  The patient will follow up after his endoscopic ultrasound.  The risks of an EUS including intestinal perforation, bleeding, infection, aspiration, and medication effects were discussed as was the possibility it may not give a definitive diagnosis if a biopsy is performed.  When a biopsy of the pancreas is done as part of the  EUS, there is an additional risk of pancreatitis at the rate of about 1-2%.  It was explained that procedure related pancreatitis is typically mild, although it can be severe and even life threatening, which is why we do not perform random pancreatic biopsies and only biopsy a lesion/area we feel is concerning enough to warrant the risk.  The risks and benefits of endoscopic evaluation were discussed with the patient; these include but are not limited to the risk of perforation, infection, bleeding, missed lesions, lack of diagnosis, severe illness requiring hospitalization, as well as anesthesia and sedation related illnesses.  The patient and/or family is agreeable to proceed.    PLAN  Laboratories as outlined below H. pylori stool antigen to be obtained Fecal elastase to be obtained Inflammatory markers to evaluate for chronic inflammation to be obtained Proceed with endoscopic ultrasound with lymphadenopathy sampling if feasible Obtain Patrick on records for serological work-up that has been performed Will need to consider MRI/MRCP imaging as well pending elevation in alkaline phosphatase   Orders Placed This Encounter  Procedures    Procedural/ Surgical Case Request: UPPER ESOPHAGEAL ENDOSCOPIC ULTRASOUND (EUS)   Helicobacter pylori special antigen   CBC   Comp Met (CMET)   Sedimentation rate   C-reactive protein   Tissue transglutaminase, IgA   INR/PT   Hepatitis A antibody, total   Hepatitis B Surface AntiBODY   Hepatitis B Surface AntiGEN   Hepatitis B Core Antibody, total   Hepatitis C Antibody   Gamma GT   Pancreatic elastase, fecal   Alpha galactosidase   Alkaline Phosphatase, Isoenzymes   IgA   Ambulatory referral to Gastroenterology   Ambulatory referral to Dermatology    New Prescriptions   No medications on file   Modified Medications   No medications on file    Planned Follow Up No follow-ups on file.   Total Time in Face-to-Face and in Coordination of Care for patient including independent/personal interpretation/review of prior testing, medical history, examination, medication adjustment, communicating results with the patient directly, and documentation with the EHR is 45 minutes.   Justice Britain, MD [facility]oscopy Office # [phone]

## 2021-05-25 ENCOUNTER — Encounter: Payer: Self-pay | Admitting: Gastroenterology

## 2021-05-25 DIAGNOSIS — R590 Localized enlarged lymph nodes: Secondary | ICD-10-CM | POA: Insufficient documentation

## 2021-05-25 DIAGNOSIS — R945 Abnormal results of liver function studies: Secondary | ICD-10-CM | POA: Insufficient documentation

## 2021-05-25 DIAGNOSIS — R142 Eructation: Secondary | ICD-10-CM | POA: Insufficient documentation

## 2021-05-25 DIAGNOSIS — R141 Gas pain: Secondary | ICD-10-CM | POA: Insufficient documentation

## 2021-05-25 DIAGNOSIS — R7989 Other specified abnormal findings of blood chemistry: Secondary | ICD-10-CM | POA: Insufficient documentation

## 2021-05-25 DIAGNOSIS — R21 Rash and other nonspecific skin eruption: Secondary | ICD-10-CM | POA: Insufficient documentation

## 2021-05-25 DIAGNOSIS — R194 Change in bowel habit: Secondary | ICD-10-CM | POA: Insufficient documentation

## 2021-05-25 LAB — ALPHA GALACTOSIDASE

## 2021-05-25 LAB — SPECIMEN STATUS REPORT

## 2021-05-28 LAB — HEPATITIS B SURFACE ANTIBODY,QUALITATIVE: Hep B S Ab: REACTIVE — AB

## 2021-05-28 LAB — HEPATITIS C ANTIBODY
Hepatitis C Ab: NONREACTIVE
SIGNAL TO CUT-OFF: 0.01 (ref <1.00)

## 2021-05-28 LAB — HEPATITIS A ANTIBODY, TOTAL: Hepatitis A AB,Total: NONREACTIVE

## 2021-05-28 LAB — HEPATITIS B CORE ANTIBODY, TOTAL: Hep B Core Total Ab: NONREACTIVE

## 2021-05-28 LAB — ALKALINE PHOSPHATASE, ISOENZYMES
Alkaline Phosphatase: 170 IU/L — ABNORMAL HIGH (ref 44–121)
BONE FRACTION: 22 % (ref 12–68)
INTESTINAL FRAC.: 8 % (ref 0–18)
LIVER FRACTION: 70 % (ref 13–88)

## 2021-05-28 LAB — HEPATITIS B SURFACE ANTIGEN: Hepatitis B Surface Ag: NONREACTIVE

## 2021-05-28 LAB — IGA: Immunoglobulin A: 239 mg/dL (ref 47–310)

## 2021-05-28 LAB — TISSUE TRANSGLUTAMINASE, IGA: (tTG) Ab, IgA: <1 U/mL

## 2021-06-25 NOTE — Progress Notes (Signed)
Attempted to obtain medical history via telephone, unable to reach at this time. I left a voicemail to return pre surgical testing department's phone call.  

## 2021-07-01 ENCOUNTER — Telehealth: Payer: Self-pay

## 2021-07-01 ENCOUNTER — Encounter (HOSPITAL_COMMUNITY): Payer: Self-pay | Admitting: Certified Registered Nurse Anesthetist

## 2021-07-01 ENCOUNTER — Ambulatory Visit (HOSPITAL_COMMUNITY): Admission: RE | Admit: 2021-07-01 | Payer: 59 | Source: Home / Self Care | Admitting: Gastroenterology

## 2021-07-01 ENCOUNTER — Encounter (HOSPITAL_COMMUNITY): Admission: RE | Payer: Self-pay | Source: Home / Self Care

## 2021-07-01 SURGERY — UPPER ESOPHAGEAL ENDOSCOPIC ULTRASOUND (EUS)
Anesthesia: Monitor Anesthesia Care

## 2021-07-01 MED ORDER — PROPOFOL 1000 MG/100ML IV EMUL
INTRAVENOUS | Status: AC
  Filled 2021-07-01: qty 100

## 2021-07-01 MED ORDER — PROPOFOL 500 MG/50ML IV EMUL
INTRAVENOUS | Status: AC
  Filled 2021-07-01: qty 50

## 2021-07-01 NOTE — Anesthesia Preprocedure Evaluation (Deleted)
Anesthesia Evaluation  Patient identified by MRN, date of birth, ID band Patient awake    Reviewed: Allergy & Precautions, NPO status , Patient's Chart, lab work & pertinent test results  Airway        Dental   Pulmonary Current Smoker,  Pulmonary sarcoidosis          Cardiovascular negative cardio ROS       Neuro/Psych Anxiety negative neurological ROS     GI/Hepatic Neg liver ROS, GERD  ,  Endo/Other  negative endocrine ROS  Renal/GU negative Renal ROS  negative genitourinary   Musculoskeletal negative musculoskeletal ROS (+)   Abdominal   Peds negative pediatric ROS (+)  Hematology negative hematology ROS (+)   Anesthesia Other Findings   Reproductive/Obstetrics negative OB ROS                             Anesthesia Physical Anesthesia Plan  ASA: 2  Anesthesia Plan: MAC   Post-op Pain Management:    Induction: Intravenous  PONV Risk Score and Plan: 1 and Propofol infusion  Airway Management Planned: Simple Face Mask  Additional Equipment:   Intra-op Plan:   Post-operative Plan:   Informed Consent: I have reviewed the patients History and Physical, chart, labs and discussed the procedure including the risks, benefits and alternatives for the proposed anesthesia with the patient or authorized representative who has indicated his/her understanding and acceptance.     Dental advisory given  Plan Discussed with: CRNA and Surgeon  Anesthesia Plan Comments:         Anesthesia Quick Evaluation

## 2021-07-01 NOTE — Telephone Encounter (Signed)
Sean Burch, Sean Starring., Sean Burch  Viviana Simpler, RN; Luciano Cutter, Sean Burch; Loretha Stapler, RN Sean Burch,  Please reach out to patient this week.  He can be scheduled for 1 more attempt at EUS - this is now 3 cancellations.  If he is rescheduled and cancels or no-shows he will have to be charged late-fee and then may need to seek care/workup elsewhere.  I know he works evenings so schedules can change, even though non-malicious, we cannot continue to lose these procedure slots.  GM   FYI Dr. Everardo All for another missed EUS.

## 2021-07-01 NOTE — Telephone Encounter (Signed)
Left message on machine to call back  

## 2021-07-01 NOTE — Progress Notes (Signed)
Left message as patient has not shown up for procedure yet.asked to return call [phone]

## 2021-07-02 NOTE — Telephone Encounter (Signed)
I have spoken to the pt and he states he "forgot" his appts for EUS.  He has asked to come in and speak with Dr Meridee Score prior to rescheduling.  Appt has been made for 08/09/21 at 910 am.

## 2021-07-02 NOTE — Telephone Encounter (Signed)
OK. Thanks. GM

## 2021-08-09 ENCOUNTER — Ambulatory Visit: Payer: 59 | Admitting: Gastroenterology

## 2021-09-11 ENCOUNTER — Ambulatory Visit: Payer: 59 | Admitting: Gastroenterology

## 2023-01-10 IMAGING — CT CT ANGIO CHEST-ABD-PELV FOR DISSECTION W/ AND WO/W CM
2 of 9 series · 14 of 46 positions shown, 16 images · IV contrast (Omnipaque)
Comparison: Portable chest earlier today.

CLINICAL DATA: 39-year-old male with chest pain and pressure.
Abdominal pain.

EXAM:
CT ANGIOGRAPHY CHEST, ABDOMEN AND PELVIS
TECHNIQUE: Non-contrast CT of the chest was initially obtained.

[Series 6: axial arterial · axial · arterial · 0.86mm/px · z∈[+767,+1400]mm · 11 of 237 slices shown, 13 images]
[im 13/237  soft-tissue]
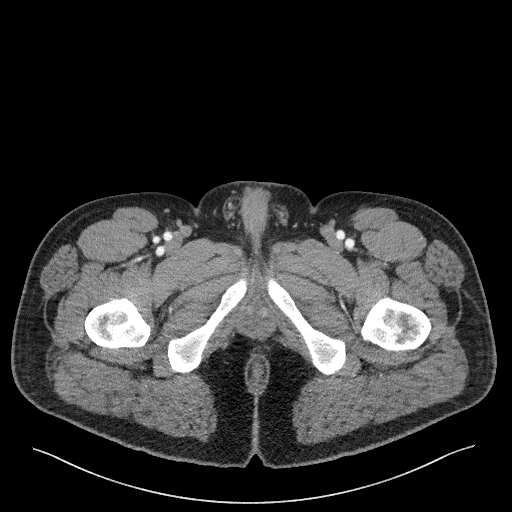
[im 13/237  bone]
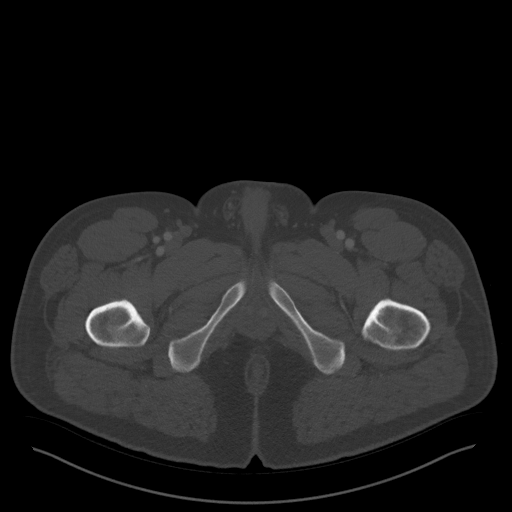
[im 38/237  soft-tissue]
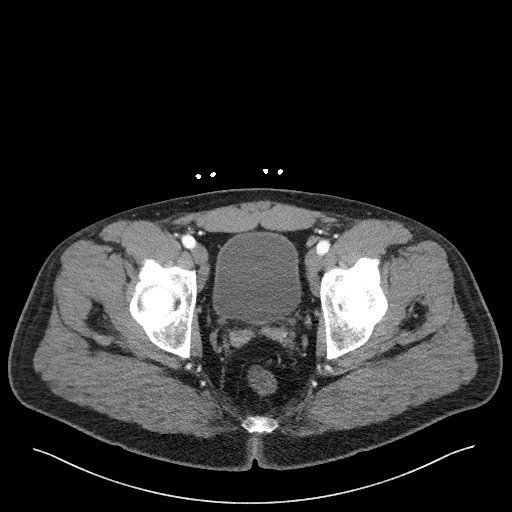
[im 63/237  soft-tissue]
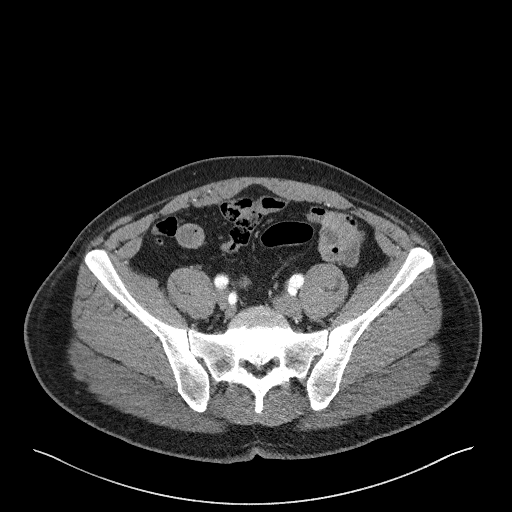
[im 75/237  soft-tissue]
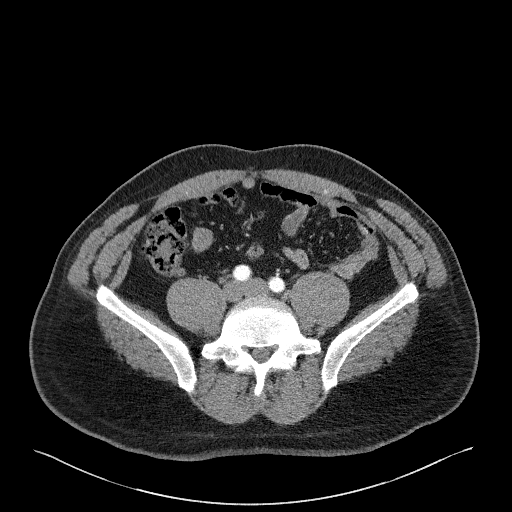
[im 100/237  soft-tissue]
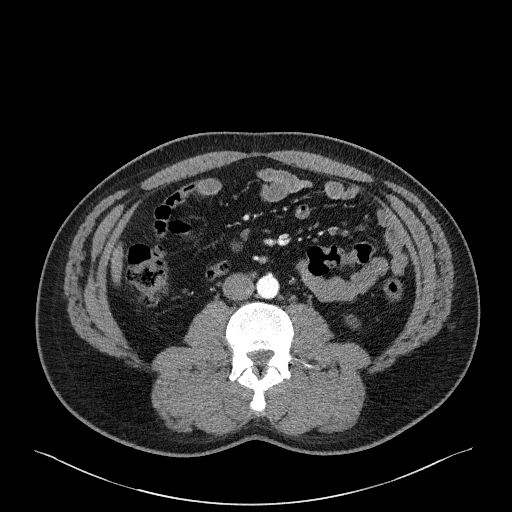
[im 125/237  soft-tissue]
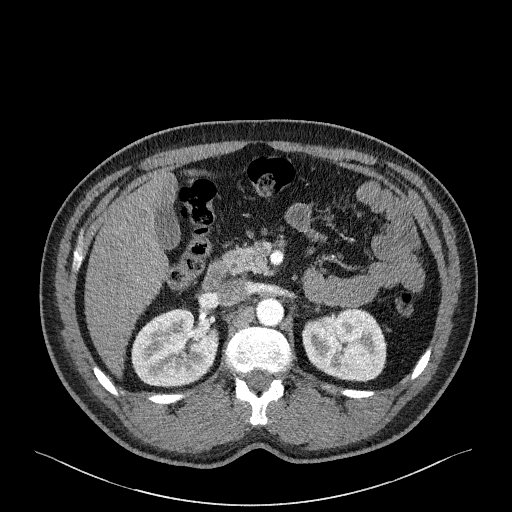
[im 137/237  soft-tissue]
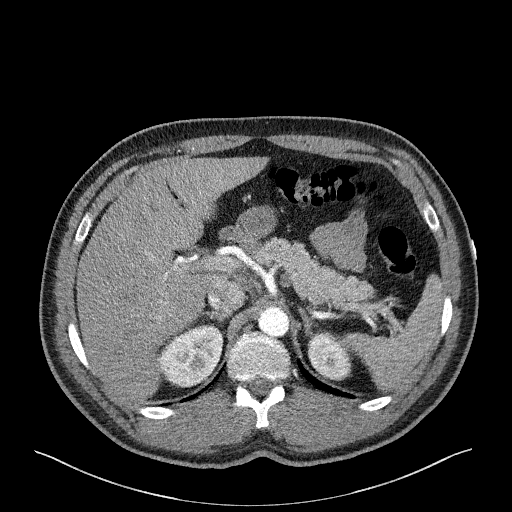
[im 162/237  soft-tissue]
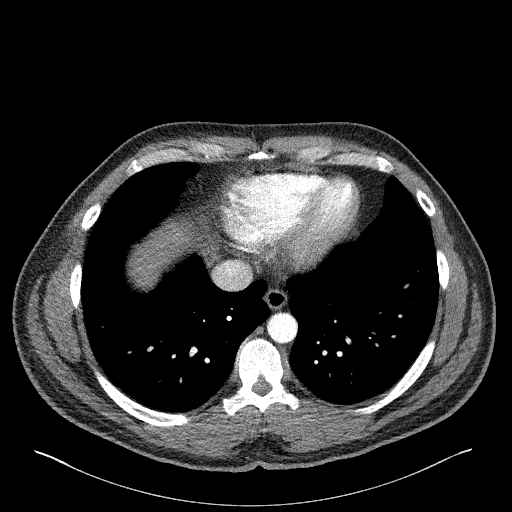
[im 174/237  soft-tissue]
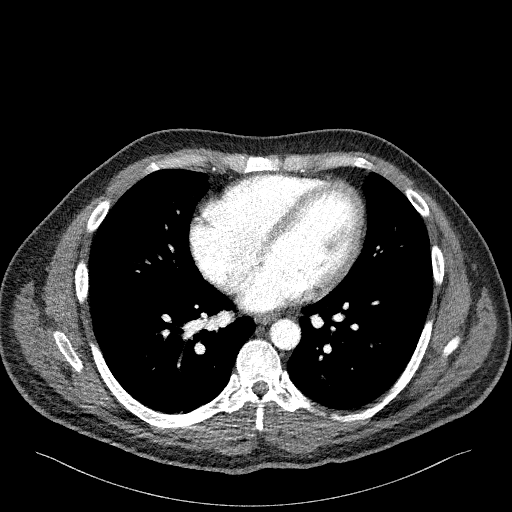
[im 174/237  bone]
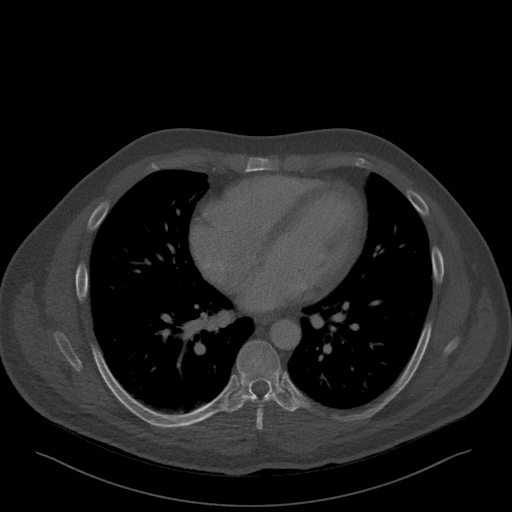
[im 199/237  soft-tissue]
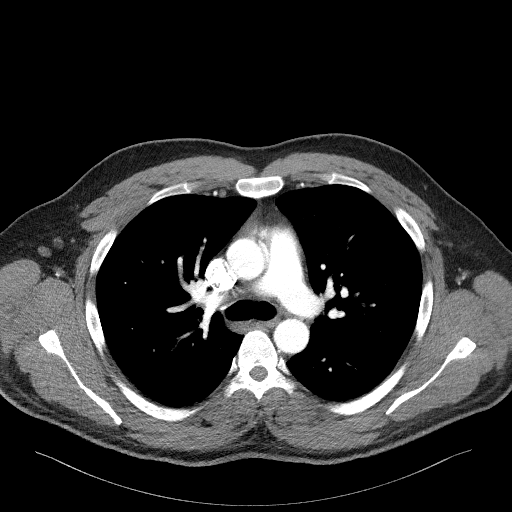
[im 224/237  soft-tissue]
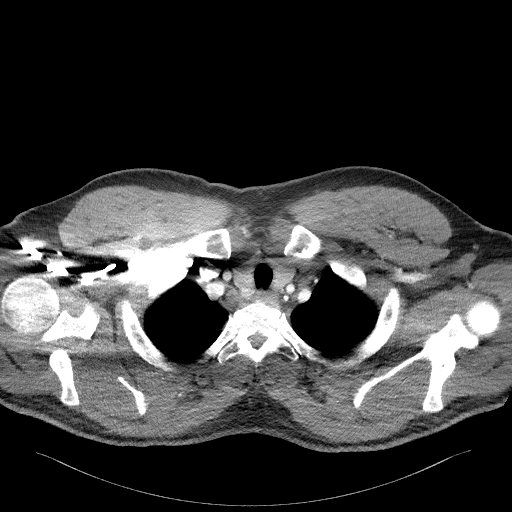

[Series 8: coronals · coronal · 0.94mm/px · 3 of 151 slices shown]
[im 38/151  soft-tissue]
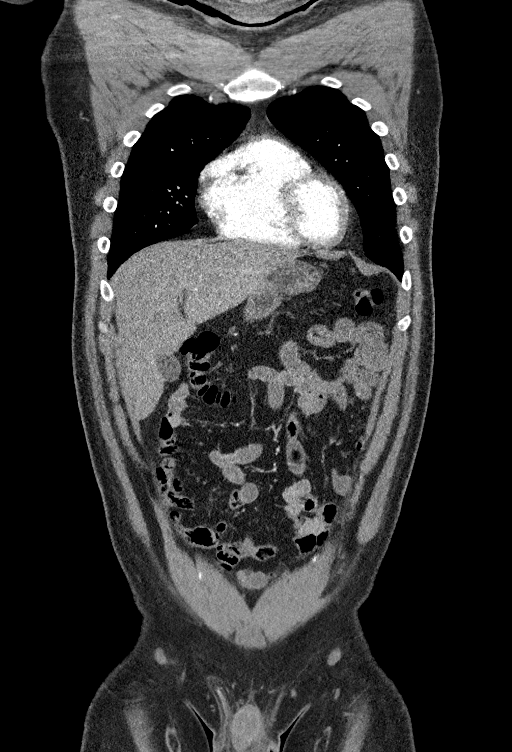
[im 76/151  soft-tissue]
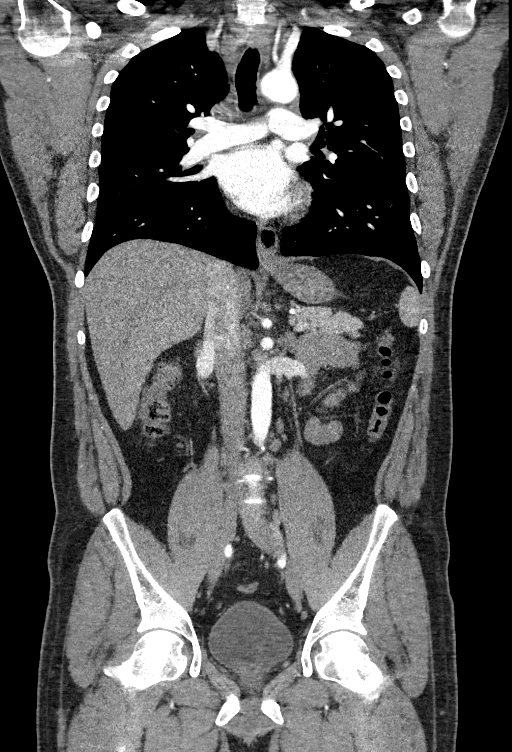
[im 113/151  soft-tissue]
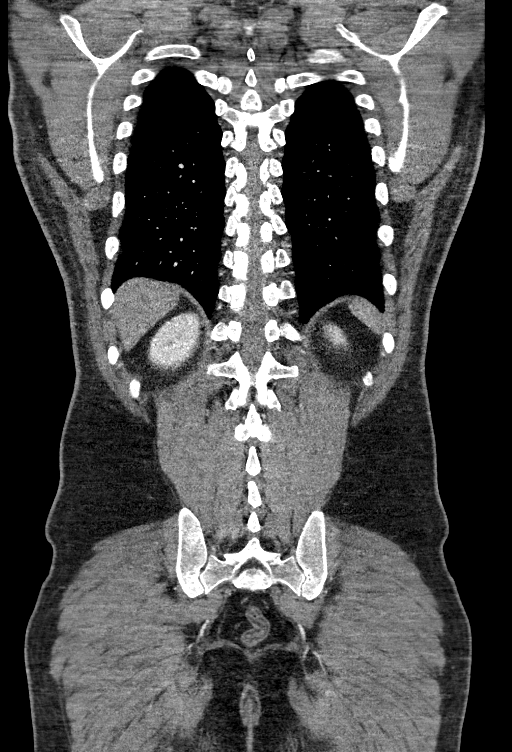

[14 of 46 positions shown; findings below may reference images not displayed]

Multidetector CT imaging through the chest, abdomen and pelvis was
performed using the standard protocol during bolus administration of
intravenous contrast. Multiplanar reconstructed images and MIPs were
obtained and reviewed to evaluate the vascular anatomy.

CONTRAST:  100mL OMNIPAQUE IOHEXOL 350 MG/ML SOLN
FINDINGS: CTA CHEST FINDINGS

Cardiovascular: No cardiomegaly or pericardial effusion. No
calcified coronary artery atherosclerosis is evident. Normal
thoracic aorta. Four vessel arch configuration (left vertebral
arises directly from the arch), normal variant. Central pulmonary
arteries appear patent.

Mediastinum/Nodes: Small volume residual thymus. Pre-vascular and
other mediastinal lymph nodes are at the upper limits of normal,
10-11 mm short axis. No significant hilar lymphadenopathy. No
axillary lymphadenopathy.

Lungs/Pleura: Small volume retained secretions in the trachea on the
left. Otherwise the major airways are patent. There is early
centrilobular emphysema in the right upper lobe (series 7, image
18), and to a lesser extent the left upper lobe. Lungs are otherwise
negative. No pneumothorax or pleural effusion.

Musculoskeletal: Negative.

Review of the MIP images confirms the above findings.

CTA ABDOMEN AND PELVIS FINDINGS

VASCULAR

Normal abdominal aorta and major arterial branches in the abdomen.
Patent and normal visible iliofemoral arteries.

Review of the MIP images confirms the above findings.

NON-VASCULAR

Hepatobiliary: Negative liver and gallbladder.

Pancreas: Negative.

Spleen: Negative.

Adrenals/Urinary Tract: Normal adrenal glands. Bilateral renal
enhancement is symmetric and normal. No hydronephrosis. No
nephrolithiasis is evident. No perinephric stranding. Both ureters
and the bladder appear negative.

Stomach/Bowel: Negative large bowel aside from redundant sigmoid.
Normal retrocecal appendix (series 6, image 176). Negative terminal
ileum. No dilated small bowel. Stomach and duodenum are mostly
decompressed. No bowel inflammation identified. No free air or free
fluid.

Lymphatic: Small but conspicuous upper abdominal lymph nodes,
gastrohepatic ligament. Similar small but increased in number
retroperitoneal lymph nodes in the abdomen. External iliac Chain
lymph nodes also appear increased more so the right (12-13 mm). But
no inguinal lymphadenopathy.

Reproductive: Negative.

Other: No pelvic free fluid.

Musculoskeletal: Negative.

Review of the MIP images confirms the above findings.
IMPRESSION: 1. Normal aorta.  No arterial abnormality identified.

2. Indeterminate low level lymphadenopathy in the mediastinum,
abdomen and pelvis.
A lymphoproliferative disorder cannot be excluded, although there is
no axillary or inguinal lymphadenopathy.
Systemic process such as sarcoidosis would also be a consideration,
although hilar lymph nodes are relatively spared.

3. No other acute or inflammatory process.
Early upper lobe Emphysema ([id number]).

## 2023-04-28 ENCOUNTER — Ambulatory Visit: Payer: 59 | Admitting: Podiatry

## 2023-05-19 ENCOUNTER — Ambulatory Visit: Payer: 59 | Admitting: Podiatry

## 2023-06-05 ENCOUNTER — Ambulatory Visit: Payer: 59 | Admitting: Podiatry

## 2023-06-16 ENCOUNTER — Ambulatory Visit: Payer: 59 | Admitting: Podiatry
